# Patient Record
Sex: Male | Born: 1975
Health system: Southern US, Community
[De-identification: ages and names within clinical notes are randomized; demographics above are authoritative.]

## PROBLEM LIST (undated history)

## (undated) DIAGNOSIS — R0789 Other chest pain: Secondary | ICD-10-CM

## (undated) DIAGNOSIS — F419 Anxiety disorder, unspecified: Secondary | ICD-10-CM

## (undated) DIAGNOSIS — T7840XA Allergy, unspecified, initial encounter: Secondary | ICD-10-CM

## (undated) DIAGNOSIS — I10 Essential (primary) hypertension: Secondary | ICD-10-CM

## (undated) DIAGNOSIS — S61219A Laceration without foreign body of unspecified finger without damage to nail, initial encounter: Secondary | ICD-10-CM

## (undated) DIAGNOSIS — G43909 Migraine, unspecified, not intractable, without status migrainosus: Secondary | ICD-10-CM

## (undated) DIAGNOSIS — J45909 Unspecified asthma, uncomplicated: Secondary | ICD-10-CM

## (undated) DIAGNOSIS — G4730 Sleep apnea, unspecified: Secondary | ICD-10-CM

## (undated) DIAGNOSIS — E785 Hyperlipidemia, unspecified: Secondary | ICD-10-CM

## (undated) HISTORY — DX: Allergy, unspecified, initial encounter: T78.40XA

## (undated) HISTORY — DX: Essential (primary) hypertension: I10

## (undated) HISTORY — PX: ADENOIDECTOMY: SUR15

## (undated) HISTORY — DX: Hyperlipidemia, unspecified: E78.5

## (undated) HISTORY — PX: TYMPANOSTOMY TUBE PLACEMENT: SHX32

## (undated) HISTORY — DX: Sleep apnea, unspecified: G47.30

## (undated) HISTORY — DX: Other chest pain: R07.89

## (undated) HISTORY — DX: Laceration without foreign body of unspecified finger without damage to nail, initial encounter: S61.219A

## (undated) HISTORY — DX: Anxiety disorder, unspecified: F41.9

## (undated) HISTORY — DX: Unspecified asthma, uncomplicated: J45.909

## (undated) HISTORY — DX: Migraine, unspecified, not intractable, without status migrainosus: G43.909

---
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
## 2003-06-21 HISTORY — PX: APPENDECTOMY: SHX54

## 2006-06-20 HISTORY — PX: MYRINGOTOMY WITH TUBE PLACEMENT: SHX5663

## 2014-05-03 ENCOUNTER — Emergency Department (HOSPITAL_COMMUNITY)
Admission: EM | Admit: 2014-05-03 | Discharge: 2014-05-03 | Disposition: A | Discharge status: Home / Self Care | Payer: Managed Care, Other (non HMO) | Attending: Emergency Medicine | Admitting: Emergency Medicine

## 2014-05-03 ENCOUNTER — Emergency Department (HOSPITAL_COMMUNITY): Payer: Managed Care, Other (non HMO)

## 2014-05-03 ENCOUNTER — Encounter (HOSPITAL_COMMUNITY): Payer: Self-pay | Admitting: Emergency Medicine

## 2014-05-03 DIAGNOSIS — S61213A Laceration without foreign body of left middle finger without damage to nail, initial encounter: Secondary | ICD-10-CM | POA: Insufficient documentation

## 2014-05-03 DIAGNOSIS — S6992XA Unspecified injury of left wrist, hand and finger(s), initial encounter: Secondary | ICD-10-CM

## 2014-05-03 DIAGNOSIS — Z23 Encounter for immunization: Secondary | ICD-10-CM | POA: Insufficient documentation

## 2014-05-03 DIAGNOSIS — Y9289 Other specified places as the place of occurrence of the external cause: Secondary | ICD-10-CM | POA: Diagnosis not present

## 2014-05-03 DIAGNOSIS — Y998 Other external cause status: Secondary | ICD-10-CM | POA: Diagnosis not present

## 2014-05-03 DIAGNOSIS — W270XXA Contact with workbench tool, initial encounter: Secondary | ICD-10-CM | POA: Diagnosis not present

## 2014-05-03 DIAGNOSIS — IMO0002 Reserved for concepts with insufficient information to code with codable children: Secondary | ICD-10-CM

## 2014-05-03 DIAGNOSIS — Y9389 Activity, other specified: Secondary | ICD-10-CM | POA: Diagnosis not present

## 2014-05-03 MED ORDER — LIDOCAINE HCL 1 % IJ SOLN
5.0000 mL | Freq: Once | INTRAMUSCULAR | Status: DC
  Filled 2014-05-03: qty 20

## 2014-05-03 MED ORDER — TETANUS-DIPHTH-ACELL PERTUSSIS 5-2.5-18.5 LF-MCG/0.5 IM SUSP
0.5000 mL | Freq: Once | INTRAMUSCULAR | Status: AC
  Administered 2014-05-03: 0.5 mL via INTRAMUSCULAR
  Filled 2014-05-03: qty 0.5

## 2014-05-03 NOTE — ED Provider Notes (Signed)
CSN: [id number]     Arrival date & time 05/03/14  1813 History  This chart was scribed for non-physician practitioner, John MuttonMarissa Loris Seelye, PA-C, working with Warnell Foresterrey Wofford, MD, by Modena JanskyAlbert Thayil, ED Scribe. This patient was seen in room WTR9/WTR9 and the patient's care was started at 8:10 PM.   Chief Complaint  Patient presents with  . Extremity Laceration   The history is provided by the patient. No language interpreter was used.   HPI Comments: John Herman is a 38 y.o. male with no PMHx who presents to the Emergency Department complaining of an extremity laceration that occurred about 3 hours ago. He reports that he cut his left long finger with a table saw while cutting wood without gloves. He states that he has some associated mild numbness, tingling, and pain. He denies any hx of injury to his left long finger, complete loss of sensation. He reports that he is unsure of his tetanus status.  PCP Dr. Hyacinth MeekerMiller  History reviewed. No pertinent past medical history. History reviewed. No pertinent past surgical history. History reviewed. No pertinent family history. History  Substance Use Topics  . Smoking status: Never Smoker   . Smokeless tobacco: Not on file  . Alcohol Use: No    Review of Systems  Skin: Positive for wound.  Neurological: Negative for weakness.  All other systems reviewed and are negative.   Allergies  Review of patient's allergies indicates no known allergies.  Home Medications   Prior to Admission medications   Not on File   BP 149/94 mmHg  Pulse 82  Temp(Src) 98 F (36.7 C) (Oral)  Resp 18  SpO2 100% Physical Exam  Constitutional: He is oriented to person, place, and time. He appears well-developed and well-nourished. No distress.  HENT:  Head: Normocephalic and atraumatic.  Eyes: Conjunctivae and EOM are normal. Right eye exhibits no discharge. Left eye exhibits no discharge.  Neck: Normal range of motion. Neck supple.  Cardiovascular: Normal rate,  regular rhythm and normal heart sounds.  Exam reveals no friction rub.   No murmur heard. Pulses:      Radial pulses are 2+ on the right side, and 2+ on the left side.  Cap refill < 3 seconds  Pulmonary/Chest: Effort normal and breath sounds normal. No respiratory distress. He has no wheezes. He has no rales.  Musculoskeletal: Normal range of motion.  Full range of motion identified to the left hand without difficulty. Full flexion, extension, adduction, abduction noted to the left digits without difficulty. Patient is able to produce a fist without difficulty.    Neurological: He is alert and oriented to person, place, and time. No cranial nerve deficit. He exhibits normal muscle tone. Coordination normal.  Cranial nerves III-XII grossly intact Equal grip strength bilaterally  Strength intact to MCP, PIP, DIP joints of left hand Sensation intact with differentiation to sharp and dull touch  2 point discrimination intact to ulnar and radial aspect of the left long finger   Skin: Skin is warm and dry. No rash noted. He is not diaphoretic. No erythema.  Loss of epidermis identified to the tip of the left long finger. Negative flap. Negative involvement of the nail. Bleeding controlled. Negative blackening of tissues. Negative signs of ischemia.   Psychiatric: He has a normal mood and affect. His behavior is normal. Thought content normal.  Nursing note and vitals reviewed.   ED Course  Procedures (including critical care time) DIAGNOSTIC STUDIES: Oxygen Saturation is 100% on RA, normal by  my interpretation.    COORDINATION OF CARE: 8:14 PM- Pt advised of plan for treatment which includes medication and radiology and pt agrees.  Labs Review Labs Reviewed - No data to display  Imaging Review Dg Finger Middle Left  05/03/2014   CLINICAL DATA:  Left middle finger laceration, cut with table saw 05/03/2014  EXAM: LEFT MIDDLE FINGER 2+V  COMPARISON:  None.  FINDINGS: Three views of left  third finger submitted. No acute fracture or subluxation. No radiopaque foreign body.  IMPRESSION: Negative.   Electronically Signed   By: Natasha MeadLiviu  Pop M.D.   On: 05/03/2014 20:06     EKG Interpretation None      MDM   Final diagnoses:  Injury of left middle finger, initial encounter    Medications  Tdap (BOOSTRIX) injection 0.5 mL (0.5 mLs Intramuscular Given 05/03/14 2116)   Filed Vitals:   05/03/14 1839  BP: 149/94  Pulse: 82  Temp: 98 F (36.7 C)  TempSrc: Oral  Resp: 18  SpO2: 100%   I personally performed the services described in this documentation, which was scribed in my presence. The recorded information has been reviewed and is accurate.  Patient presenting to emergency department with a wound to the left long finger, tip that occurred this afternoon at approximately 5:30 PM when the patient was cutting wood without gloves using a table saw. Patient reported that he is unsure of his last tetanus shot. Loss of epidermis identified to the tip of the left long finger with no flap. Negative involvement of nail. Full range of motion identified to the digits of the left hand without difficulty or ataxia. Patient is able to produce a fist without difficulty. Sensation intact. 2. Discrimination intact. Negative signs of ischemia. Pulses palpable and strong. Negative focal neurological deficits identified. Imaging unremarkable for foreign body or fracture. Wound soaked in thoroughly cleaned. Xeroform placed and wound covered with gauze. Patient given tetanus in ED setting. Patient stable, afebrile. Patient not septic appearing. Discharged patient. Discussed with patient to rest and stay hydrated. Discussed with patient proper wound care. Referred patient to primary care provider and hand specialist. Discussed with patient to closely monitor symptoms and if symptoms are to worsen or change to report back to the ED - strict return instructions given.  Patient agreed to plan of care,  understood, all questions answered.   John MuttonMarissa Jamaury Gumz, PA-C 05/03/14 2122  Warnell Foresterrey Wofford, MD 05/03/14 2250

## 2014-05-03 NOTE — ED Notes (Signed)
Pt states that he cut his lt middle end of finger.  Jagged cut to the end of finger.  Has not had tetanus shot recently.

## 2014-05-03 NOTE — Discharge Instructions (Signed)
Please call your doctor for a followup appointment within 24-48 hours. When you talk to your doctor please let them know that you were seen in the emergency department and have them acquire all of your records so that they can discuss the findings with you and formulate a treatment plan to fully care for your new and ongoing problems. Please call and set-up an appointment with your primary care provider and hand specialist Please rest and stay hydrated Please wash with warm water and soap, massage gently, gently wash away with water and pat day  Please apply Xeroform and dressing - dressing must be dry if gets wet please replace Please continue to monitor symptoms closely and if symptoms are to worsen or change (fever greater than 101, chills, sweating, nausea, vomiting, chest pain, shortness of breathe, difficulty breathing, weakness, numbness, tingling, worsening or changes to pain pattern, swelling, loss of sensation, drainage, bleeding, pus drainage, redness to the skin, hot to touch, fall, injury) please report back to the Emergency Department immediately.   Laceration Care, Adult A laceration is a cut or lesion that goes through all layers of the skin and into the tissue just beneath the skin. TREATMENT  Some lacerations may not require closure. Some lacerations may not be able to be closed due to an increased risk of infection. It is important to see your caregiver as soon as possible after an injury to minimize the risk of infection and maximize the opportunity for successful closure. If closure is appropriate, pain medicines may be given, if needed. The wound will be cleaned to help prevent infection. Your caregiver will use stitches (sutures), staples, wound glue (adhesive), or skin adhesive strips to repair the laceration. These tools bring the skin edges together to allow for faster healing and a better cosmetic outcome. However, all wounds will heal with a scar. Once the wound has healed,  scarring can be minimized by covering the wound with sunscreen during the day for 1 full year. HOME CARE INSTRUCTIONS  For sutures or staples:  Keep the wound clean and dry.  If you were given a bandage (dressing), you should change it at least once a day. Also, change the dressing if it becomes wet or dirty, or as directed by your caregiver.  Wash the wound with soap and water 2 times a day. Rinse the wound off with water to remove all soap. Pat the wound dry with a clean towel.  After cleaning, apply a thin layer of the antibiotic ointment as recommended by your caregiver. This will help prevent infection and keep the dressing from sticking.  You may shower as usual after the first 24 hours. Do not soak the wound in water until the sutures are removed.  Only take over-the-counter or prescription medicines for pain, discomfort, or fever as directed by your caregiver.  Get your sutures or staples removed as directed by your caregiver. For skin adhesive strips:  Keep the wound clean and dry.  Do not get the skin adhesive strips wet. You may bathe carefully, using caution to keep the wound dry.  If the wound gets wet, pat it dry with a clean towel.  Skin adhesive strips will fall off on their own. You may trim the strips as the wound heals. Do not remove skin adhesive strips that are still stuck to the wound. They will fall off in time. For wound adhesive:  You may briefly wet your wound in the shower or bath. Do not soak or scrub the  wound. Do not swim. Avoid periods of heavy perspiration until the skin adhesive has fallen off on its own. After showering or bathing, gently pat the wound dry with a clean towel.  Do not apply liquid medicine, cream medicine, or ointment medicine to your wound while the skin adhesive is in place. This may loosen the film before your wound is healed.  If a dressing is placed over the wound, be careful not to apply tape directly over the skin adhesive. This  may cause the adhesive to be pulled off before the wound is healed.  Avoid prolonged exposure to sunlight or tanning lamps while the skin adhesive is in place. Exposure to ultraviolet light in the first year will darken the scar.  The skin adhesive will usually remain in place for 5 to 10 days, then naturally fall off the skin. Do not pick at the adhesive film. You may need a tetanus shot if:  You cannot remember when you had your last tetanus shot.  You have never had a tetanus shot. If you get a tetanus shot, your arm may swell, get red, and feel warm to the touch. This is common and not a problem. If you need a tetanus shot and you choose not to have one, there is a rare chance of getting tetanus. Sickness from tetanus can be serious. SEEK MEDICAL CARE IF:   You have redness, swelling, or increasing pain in the wound.  You see a red line that goes away from the wound.  You have yellowish-white fluid (pus) coming from the wound.  You have a fever.  You notice a bad smell coming from the wound or dressing.  Your wound breaks open before or after sutures have been removed.  You notice something coming out of the wound such as wood or glass.  Your wound is on your hand or foot and you cannot move a finger or toe. SEEK IMMEDIATE MEDICAL CARE IF:   Your pain is not controlled with prescribed medicine.  You have severe swelling around the wound causing pain and numbness or a change in color in your arm, hand, leg, or foot.  Your wound splits open and starts bleeding.  You have worsening numbness, weakness, or loss of function of any joint around or beyond the wound.  You develop painful lumps near the wound or on the skin anywhere on your body. MAKE SURE YOU:   Understand these instructions.  Will watch your condition.  Will get help right away if you are not doing well or get worse. Document Released: 06/06/2005 Document Revised: 08/29/2011 Document Reviewed:  11/30/2010 [facility] Patient Information 2015 [place], [place]. This information is not intended to replace advice given to you by your health care provider. Make sure you discuss any questions you have with your health care provider.

## 2014-12-23 ENCOUNTER — Encounter (HOSPITAL_COMMUNITY): Payer: Self-pay | Admitting: Emergency Medicine

## 2014-12-23 ENCOUNTER — Emergency Department (HOSPITAL_COMMUNITY)
Admission: EM | Admit: 2014-12-23 | Discharge: 2014-12-23 | Disposition: A | Discharge status: Home / Self Care | Payer: Managed Care, Other (non HMO) | Attending: Emergency Medicine | Admitting: Emergency Medicine

## 2014-12-23 ENCOUNTER — Emergency Department (HOSPITAL_COMMUNITY): Payer: Managed Care, Other (non HMO)

## 2014-12-23 DIAGNOSIS — Z7982 Long term (current) use of aspirin: Secondary | ICD-10-CM | POA: Insufficient documentation

## 2014-12-23 DIAGNOSIS — I10 Essential (primary) hypertension: Secondary | ICD-10-CM | POA: Diagnosis not present

## 2014-12-23 DIAGNOSIS — Z79899 Other long term (current) drug therapy: Secondary | ICD-10-CM | POA: Diagnosis not present

## 2014-12-23 DIAGNOSIS — R0789 Other chest pain: Secondary | ICD-10-CM | POA: Insufficient documentation

## 2014-12-23 DIAGNOSIS — R079 Chest pain, unspecified: Secondary | ICD-10-CM | POA: Diagnosis present

## 2014-12-23 LAB — BASIC METABOLIC PANEL
Anion gap: 9 (ref 5–15)
BUN: 26 mg/dL — ABNORMAL HIGH (ref 6–20)
CALCIUM: 9.9 mg/dL (ref 8.9–10.3)
CO2: 27 mmol/L (ref 22–32)
CREATININE: 1.16 mg/dL (ref 0.61–1.24)
Chloride: 101 mmol/L (ref 101–111)
GFR calc Af Amer: >60 mL/min (ref >60)
GLUCOSE: 103 mg/dL — AB (ref 65–99)
Potassium: 3.7 mmol/L (ref 3.5–5.1)
Sodium: 137 mmol/L (ref 135–145)

## 2014-12-23 LAB — CBC
HCT: 47.6 % (ref 39.0–52.0)
Hemoglobin: 16 g/dL (ref 13.0–17.0)
MCH: 28.3 pg (ref 26.0–34.0)
MCHC: 33.6 g/dL (ref 30.0–36.0)
MCV: 84.2 fL (ref 78.0–100.0)
Platelets: 298 10*3/uL (ref 150–400)
RBC: 5.65 MIL/uL (ref 4.22–5.81)
RDW: 12.7 % (ref 11.5–15.5)
WBC: 7 10*3/uL (ref 4.0–10.5)

## 2014-12-23 LAB — I-STAT TROPONIN, ED: Troponin i, poc: 0.01 ng/mL (ref 0.00–0.08)

## 2014-12-23 NOTE — ED Notes (Signed)
Pt c/o left chest pain, left arm pain, left side pain onset Friday night, pain decreased on Saturday, then worsened on Monday. Pt states that he was playing softball on Friday and noticed black and blue ecchymosis to left chest after softball on Friday, which faded away by Sunday.

## 2014-12-23 NOTE — Discharge Instructions (Signed)
For pain control please take ibuprofen (also known as Motrin or Advil) 800mg  (this is normally 4 over the counter pills) 3 times a day  for 5 days. Take with food to minimize stomach irritation.  Please follow with your primary care doctor in the next 2 days for a check-up. They must obtain records for further management.   Do not hesitate to return to the Emergency Department for any new, worsening or concerning symptoms.    Chest Wall Pain Chest wall pain is pain in or around the bones and muscles of your chest. It may take up to 6 weeks to get better. It may take longer if you must stay physically active in your work and activities.  CAUSES  Chest wall pain may happen on its own. However, it may be caused by:  A viral illness like the flu.  Injury.  Coughing.  Exercise.  Arthritis.  Fibromyalgia.  Shingles. HOME CARE INSTRUCTIONS   Avoid overtiring physical activity. Try not to strain or perform activities that cause pain. This includes any activities using your chest or your abdominal and side muscles, especially if heavy weights are used.  Put ice on the sore area.  Put ice in a plastic bag.  Place a towel between your skin and the bag.  Leave the ice on for 15-20 minutes per hour while awake for the first 2 days.  Only take over-the-counter or prescription medicines for pain, discomfort, or fever as directed by your caregiver. SEEK IMMEDIATE MEDICAL CARE IF:   Your pain increases, or you are very uncomfortable.  You have a fever.  Your chest pain becomes worse.  You have new, unexplained symptoms.  You have nausea or vomiting.  You feel sweaty or lightheaded.  You have a cough with phlegm (sputum), or you cough up blood. MAKE SURE YOU:   Understand these instructions.  Will watch your condition.  Will get help right away if you are not doing well or get worse. Document Released: 06/06/2005 Document Revised: 08/29/2011 Document Reviewed:  01/31/2011 [facility]Care Patient Information 2015 [place], [place]. This information is not intended to replace advice given to you by your health care provider. Make sure you discuss any questions you have with your health care provider.

## 2014-12-23 NOTE — ED Provider Notes (Signed)
CSN: [id number]     Arrival date & time 12/23/14  1714 History   First MD Initiated Contact with Patient 12/23/14 1958     Chief Complaint  Patient presents with  . Chest Pain  . Arm Pain     (Consider location/radiation/quality/duration/timing/severity/associated sxs/prior Treatment) HPI   Blood pressure 139/76, pulse 79, temperature 97.6 F (36.4 C), temperature source Oral, resp. rate 18, SpO2 100 %.  John Herman is a 39 y.o. male complaining of left-sided chest pain which she rates it 1 out of 10 which is exacerbated by movement and palpation. He states that patient was playing softball on Friday, he noticed a bruise in the area where he had pain the day after, states that this bruise was light disappeared after about 24 hours. Patient denies shortness of breath, diaphoresis, cocaine, methamphetamine use, he denies active smoking but he has an 8-pack-year history, he quit 10 years ago. He is compliant with his hypertension medication. He denies any family history of ACS, diabetes, hyperlipidemia, history of DVT or PE, recent trips or immobilizations, calf pain or leg swelling.  History reviewed. No pertinent past medical history. History reviewed. No pertinent past surgical history. History reviewed. No pertinent family history. History  Substance Use Topics  . Smoking status: Never Smoker   . Smokeless tobacco: Not on file  . Alcohol Use: No    Review of Systems  10 systems reviewed and found to be negative, except as noted in the HPI.  Allergies  Review of patient's allergies indicates no known allergies.  Home Medications   Prior to Admission medications   Medication Sig Start Date End Date Taking? Authorizing Provider  acetaminophen (TYLENOL) 325 MG tablet Take 325 mg by mouth every 6 (six) hours as needed for moderate pain or headache (headache).   Yes Historical Provider, MD  aspirin 81 MG tablet Take 81 mg by mouth daily as needed for pain.   Yes Historical  Provider, MD  ibuprofen (ADVIL,MOTRIN) 200 MG tablet Take 600 mg by mouth every 6 (six) hours as needed for moderate pain.   Yes Historical Provider, MD  lisinopril (PRINIVIL,ZESTRIL) 20 MG tablet Take 20 mg by mouth daily.   Yes Historical Provider, MD  metoprolol succinate (TOPROL-XL) 100 MG 24 hr tablet Take 100 mg by mouth daily. Take with or immediately following a meal.   Yes Historical Provider, MD   BP 139/76 mmHg  Pulse 79  Temp(Src) 97.6 F (36.4 C) (Oral)  Resp 18  SpO2 100% Physical Exam  Constitutional: He is oriented to person, place, and time. He appears well-developed and well-nourished. No distress.  HENT:  Head: Normocephalic.  Mouth/Throat: Oropharynx is clear and moist.  Eyes: Conjunctivae are normal.  Neck: Normal range of motion. No JVD present. No tracheal deviation present.  Cardiovascular: Normal rate, regular rhythm and intact distal pulses.   Radial pulse equal bilaterally  Pulmonary/Chest: Effort normal and breath sounds normal. No stridor. No respiratory distress. He has no wheezes. He has no rales. He exhibits no tenderness.  No tenderness to palpation along the chest wall, chest pain is elicited when patient abducts left shoulder  Abdominal: Soft. He exhibits no distension and no mass. There is no tenderness. There is no rebound and no guarding.  Musculoskeletal: Normal range of motion. He exhibits no edema or tenderness.  No calf asymmetry, superficial collaterals, palpable cords, edema, Homans sign negative bilaterally.    Neurological: He is alert and oriented to person, place, and time.  Skin: Skin  is warm. He is not diaphoretic.  Psychiatric: He has a normal mood and affect.  Nursing note and vitals reviewed.   ED Course  Procedures (including critical care time) Labs Review Labs Reviewed  BASIC METABOLIC PANEL - Abnormal; Notable for the following:    Glucose, Bld 103 (*)    BUN 26 (*)    All other components within normal limits  CBC   I-STAT TROPOININ, ED    Imaging Review Dg Chest 2 View  12/23/2014   CLINICAL DATA:  Left side chest pain  EXAM: CHEST  2 VIEW  COMPARISON:  None.  FINDINGS: The heart size and mediastinal contours are within normal limits. Both lungs are clear. The visualized skeletal structures are unremarkable.  IMPRESSION: No active cardiopulmonary disease.   Electronically Signed   By: Natasha MeadLiviu  Pop M.D.   On: 12/23/2014 18:34     EKG Interpretation None      MDM   Final diagnoses:  Chest wall pain    Filed Vitals:   12/23/14 1800 12/23/14 2031  BP: 155/88 139/76  Pulse: 82 79  Temp: 97.6 F (36.4 C)   TempSrc: Oral   Resp: 18 18  SpO2: 100% 100%     John Herman is a pleasant 39 y.o. male presenting with chest pain onset after patient was playing softball, exacerbated by movement. This is a mild pain with no associated symptoms. EKG is nonischemic, troponin is negative, chest x-rays without infiltrate. Patient is low risk by heart score. Patient will follow with his primary care physician, we've had a discussion of return precautions, recommend NSAIDs for chest wall pain.  Evaluation does not show pathology that would require ongoing emergent intervention or inpatient treatment. Pt is hemodynamically stable and mentating appropriately. Discussed findings and plan with patient/guardian, who agrees with care plan. All questions answered. Return precautions discussed and outpatient follow up given.        John Emeryicole Marybell Robards, PA-C 12/23/14 [id number]  Arby BarretteMarcy Pfeiffer, MD 12/24/14 [phone]

## 2015-02-10 ENCOUNTER — Other Ambulatory Visit: Payer: Self-pay

## 2015-02-10 DIAGNOSIS — G4730 Sleep apnea, unspecified: Secondary | ICD-10-CM | POA: Insufficient documentation

## 2015-02-10 DIAGNOSIS — S61219A Laceration without foreign body of unspecified finger without damage to nail, initial encounter: Secondary | ICD-10-CM | POA: Insufficient documentation

## 2015-02-10 DIAGNOSIS — F419 Anxiety disorder, unspecified: Secondary | ICD-10-CM | POA: Insufficient documentation

## 2015-02-10 DIAGNOSIS — I10 Essential (primary) hypertension: Secondary | ICD-10-CM | POA: Insufficient documentation

## 2015-02-10 DIAGNOSIS — E785 Hyperlipidemia, unspecified: Secondary | ICD-10-CM | POA: Insufficient documentation

## 2015-02-10 DIAGNOSIS — G43909 Migraine, unspecified, not intractable, without status migrainosus: Secondary | ICD-10-CM | POA: Insufficient documentation

## 2015-02-12 ENCOUNTER — Telehealth: Payer: Self-pay | Admitting: *Deleted

## 2015-02-12 NOTE — Telephone Encounter (Signed)
Patient has New Patient appointment scheduled for next week with Dr. Anne Fu. Patient verbalizes that he is anxious and having what he thinks is panic attacks because he is concerned with the possibility of a potential heart problem. In early July, he was playing softball and experienced CP with radiating arm pain. He went to ED at [facility]. He states EKG normal, troponins negative and he was diagnosed with chest wall muscle pain/strain/pulled muscle.  He was released with recommendation to follow up with his PCP, Dr. Hyacinth Meeker, and he was given instruction that it may take up to six weeks to heal. He has an upcoming appt on Sept 1st for physical with PCP. Meanwhile, he self-scheduled a New Patient appt with Dr. Anne Fu for 8/30. He states his chest pain remains and he is just concerned that it is really a cardiac issue. He wants to be seen earlier than next week. Today is 8/25. His appt is 8/30. He currently denies dizziness, SOB, previous cardiac history, other complaints. He states that for a time a couple of weeks ago, it seemed to subside. However, he recently started playing softball again and the pain returned including the radiating arm pain. Advised patient that there are no New Pt appts available prior to 8/30 so the following advisements/recommendations given to patient: continue discharge instructions from ED (NSAIDS, rest), follow up with PCP until 8/30 appt with Dr. Anne Fu, return to ED for any worsening sx or new sx. Also offered patient suggestion to ask MD (Dr. Anne Fu or PCP) if this could be orthopedic/Rotator Cuff injury or anxiety related. Patient indicates that when he is not thinking of it - "I don't notice it as much, but when I do worry about it, it hurts more". Also advised that he can call PCP to see if there are other home tx (heat or cold) that would be recommended during pain episodes.Patient verbalized that if it gets worse or he has new sx he will either call PCP, go to ED or call us back,  otherwise he will keep appt with Dr. Anne Fu on 8/30. He verbalized appreciation for speaking at length with him today.

## 2015-02-17 ENCOUNTER — Encounter: Payer: Self-pay | Admitting: Cardiology

## 2015-02-17 ENCOUNTER — Ambulatory Visit (INDEPENDENT_AMBULATORY_CARE_PROVIDER_SITE_OTHER): Payer: Managed Care, Other (non HMO) | Admitting: Cardiology

## 2015-02-17 VITALS — BP 130/80 | HR 84 | Ht 75.0 in | Wt 218.4 lb

## 2015-02-17 DIAGNOSIS — R079 Chest pain, unspecified: Secondary | ICD-10-CM | POA: Insufficient documentation

## 2015-02-17 DIAGNOSIS — F419 Anxiety disorder, unspecified: Secondary | ICD-10-CM | POA: Diagnosis not present

## 2015-02-17 DIAGNOSIS — G43C0 Periodic headache syndromes in child or adult, not intractable: Secondary | ICD-10-CM

## 2015-02-17 DIAGNOSIS — R0789 Other chest pain: Secondary | ICD-10-CM

## 2015-02-17 NOTE — Progress Notes (Signed)
Cardiology Office Note   Date:  02/17/2015   ID:  John Herman, DOB Feb 22, 1976, MRN [id number]  PCP:  Neldon Labella, MD  Cardiologist:   Donato Schultz, MD       History of Present Illness: John Herman is a 39 y.o. male who presents for evaluation of chest pain. Approximately early July, went to emergency department with was described as chest discomfort, no exertional component, no shortness of breath or palpitations associated with it and was diagnosed with chest wall pain. He was given in the past ibuprofen 800 mg 3 times a day when necessary.  Pain was previously described as sharp pain in his left chest after playing baseball on 12/19/14. Baseball bat swung, immediate sharp discomfort. Went to the emergency room for evaluation. Had a bruise over his left chest wall. Troponin, chest x-ray, EKG was all normal in emergency room. He also has diagnosis of migraines, 1-2 per month and occasionally misses work for this and is having his FMLA form done by Dr. Sigmund Hazel. He also has hypertension, Toprol-XL and lisinopril are being utilized. Sleep apnea was also diagnosed in 2009. He is employed as a Financial controller.  Played soft ball again and felt pain again. Feels on left and right side. Trying to loose weight. Maybe dizziness. He had one night where he woke up with his hands feeling numb bilaterally, possible compression/carpal tunnel like symptom. He also described some anxiety/panic attacks, sometimes feels like exhaling.  He's not having any exertional chest discomfort. His father had aortic valve replacement, sees Dr. Tenny Craw.   Past Medical History  Diagnosis Date  . Hypertension   . Anxiety   . Sleep apnea   . Migraine headache   . Hyperlipidemia   . Laceration of finger   . Chest wall pain     Past Surgical History  Procedure Laterality Date  . Nasal septum surgery  1994  . Appendectomy  2005  . Myringotomy with tube placement  2008     Current Outpatient Prescriptions   Medication Sig Dispense Refill  . lisinopril (PRINIVIL,ZESTRIL) 20 MG tablet Take 20 mg by mouth daily.    . metoprolol succinate (TOPROL-XL) 100 MG 24 hr tablet Take 100 mg by mouth daily. Take with or immediately following a meal.    . SUMAtriptan (IMITREX) 100 MG tablet Take 100 mg by mouth daily as needed for migraine. May repeat in 2 hours if headache persists or recurs.     No current facility-administered medications for this visit.    Allergies:   Review of patient's allergies indicates no known allergies.    Social History:  The patient  reports that he quit smoking about 11 years ago. He does not have any smokeless tobacco history on file. He reports that he drinks alcohol. He reports that he does not use illicit drugs.   Family History:  The patient's family history includes Alcoholism in his sister; Hypertension in his father and mother; Other in his sister and sister. No early family history of coronary artery disease. Grandfather had MI. Father had aortic valve replaced.    ROS:  Please see the history of present illness.   Otherwise, review of systems are positive for none.   All other systems are reviewed and negative.    PHYSICAL EXAM: VS:  BP 130/80 mmHg  Pulse 84  Ht  (1.905 m)  Wt 218 lb 6.4 oz (99.066 kg)  BMI 27.30 kg/m2 , BMI Body mass index is 27.3  kg/(m^2). GEN: Well nourished, well developed, in no acute distress HEENT: normal Neck: no JVD, carotid bruits, or masses Cardiac: RRR; no murmurs, rubs, or gallops,no edema  Respiratory:  clear to auscultation bilaterally, normal work of breathing GI: soft, nontender, nondistended, + BS MS: no deformity or atrophy Skin: warm and dry, no rash Neuro:  Strength and sensation are intact Psych: euthymic mood, full affect   EKG:  EKG is not ordered today.   Prior EKG from 12/23/14 demonstrated sinus rhythm, heart rate 90, no other abnormalities. Personally viewed.   Recent Labs: 12/23/2014: BUN 26*;  Creatinine, Ser 1.16; Hemoglobin 16.0; Platelets 298; Potassium 3.7; Sodium 137   Troponin normal.   Lipid Panel No results found for: CHOL, TRIG, HDL, CHOLHDL, VLDL, LDLCALC, LDLDIRECT    Wt Readings from Last 3 Encounters:  02/17/15 218 lb 6.4 oz (99.066 kg)    Chest x-ray-12/23/14-personally reviewed shows no disease.  Total cholesterol 243, triglycerides 245, HDL 51, LDL 143. Prior lipid panel in 2014 was better with triglycerides of 124, LDL of 112. Creatinine 1.05 glucose 90  Other studies Reviewed: Additional studies/ records that were reviewed today include: labs, CXR, EKG, office note, ER note. Review of the above records demonstrates: as above   ASSESSMENT AND PLAN:  1.  Chest pain-risk factors include essential hypertension, hyperlipidemia. Episode in July seem to be clearly associated with injury after swinging a  bat with anterior chest bruise.  reassuring emergency room evaluation, normal troponin, normal EKG, normal chest x-ray, personally viewed. During exam, I was able to elicit some chest wall discomfort by increasing work on his pectoralis muscles. I encouraged stretching before and after exercise. I do not appreciate any murmur on exam. His father had aortic valve replacement, seen by Dr. Tenny Craw. I discussed with him the possibility of bicuspid valve. We will go ahead and continue to encourage exercise. I do believe that his symptoms were chest wall/musculoskeletal related. If they worsen, may seek advice from orthopedist or perhaps perform some physical therapy. Reassurance has been given.  2. Anxiety-encouraged mindfulness techniques, deep breathing. Lavender oil may help.  3. Sleep apnea-encourage CPAP.  4. Migraines-per primary, Dr. Hyacinth Meeker.     Current medicines are reviewed at length with the patient today.  The patient does not have concerns regarding medicines.  The following changes have been made:  no change  Labs/ tests ordered today include: none No  orders of the defined types were placed in this encounter.     Disposition:   FU with Skains in 1 year. Requested for prevention given his grandfather heart attack. Father, valve replacement.  Mathews Robinsons, MD  02/17/2015 10:10 AM    [facility] [address] [place], [place], [place]  [address] Phone: [phone]; Fax: [phone]

## 2015-02-17 NOTE — Patient Instructions (Signed)
Medication Instructions:  Your physician recommends that you continue on your current medications as directed. Please refer to the Current Medication list given to you today.  Follow-Up: Follow up in 1 year with Dr. Skains.  You will receive a letter in the mail 2 months before you are due.  Please call us when you receive this letter to schedule your follow up appointment.  Thank you for choosing [facility]!!       

## 2015-04-22 NOTE — Progress Notes (Signed)
Cardiology Office Note   Date:  04/23/2015   ID:  John Herman, DOB Mar 10, 1976, MRN [id number]  PCP:  Neldon Labella, MD  Cardiologist:  Dr. Anne Fu   Chest pain    History of Present Illness: John Herman is a 39 y.o. male with a history of HTN, HLD, migraines, OSA, anxiety and atypical chest pain who presents to clinic for recurrent chest pain.  He presented to the ED in 12/2014 with chest discomfort and diagnosed with chest wall pain and given ibuprofen 800 mg 3 times a day when necessary. Pain was previously described as sharp pain in his left chest after playing baseball on 12/19/14. Baseball bat swung, immediate sharp discomfort. Went to the emergency room for evaluation. Had a bruise over his left chest wall. Troponin, chest x-ray, EKG was all normal in emergency room.   He was seen by Dr. Anne Fu in the office on 02/17/15 for atypical chest pain. Dr. Anne Fu felt his symptoms were chest wall/musculoskeletal related as it was clearly after trauma and could be elicited with palpation.  If they worsen, he recommended that he seek advice from orthopedist or perhaps perform some physical therapy. They also discussed the possibility for bicuspid aortic valve, but no murmur appreciated on exam.  Today he presents for recurrent chest pain. His chest pain had gone away until this past Sunday when he was watching football. He had two quick, sharp pains with radiation to left arm and neck and even in leg. Sometimes he has right neck and arm tightness as well. Since Sunday he has had chest tightness everyday intermittently. He did have some chest pain this AM, but none right now. He does not notice this with exertion. No associated SOB, nausea or diaphoresis. No swelling in his legs or trouble lying flat.   No family history of CAD. His father had AS with AVR and grandfather aortic dissection.    Past Medical History  Diagnosis Date  . Hypertension   . Anxiety   . Sleep apnea   . Migraine  headache   . Hyperlipidemia   . Laceration of finger   . Chest wall pain     Past Surgical History  Procedure Laterality Date  . Nasal septum surgery  1994  . Appendectomy  2005  . Myringotomy with tube placement  2008     Current Outpatient Prescriptions  Medication Sig Dispense Refill  . atorvastatin (LIPITOR) 20 MG tablet Take 1 tablet (20 mg total) by mouth daily. 30 tablet 11  . lisinopril (PRINIVIL,ZESTRIL) 20 MG tablet Take 20 mg by mouth daily.    . metoprolol succinate (TOPROL-XL) 100 MG 24 hr tablet Take 100 mg by mouth daily. Take with or immediately following a meal.    . nitroGLYCERIN (NITROSTAT) 0.4 MG SL tablet Place 1 tablet (0.4 mg total) under the tongue every 5 (five) minutes as needed for chest pain. 25 tablet 3  . SUMAtriptan (IMITREX) 100 MG tablet Take 100 mg by mouth daily as needed for migraine. May repeat in 2 hours if headache persists or recurs.     No current facility-administered medications for this visit.    Allergies:   Review of patient's allergies indicates no known allergies.    Social History:  The patient  reports that he quit smoking about 11 years ago. He does not have any smokeless tobacco history on file. He reports that he drinks alcohol. He reports that he does not use illicit drugs.   Family History:  The patient'sfamily history includes Alcoholism in his sister; Hypertension in his father and mother; Other in his sister and sister.    ROS:  Please see the history of present illness.   Otherwise, review of systems are positive for none.   All other systems are reviewed and negative.    PHYSICAL EXAM: VS:  BP 138/88 mmHg  Pulse 84  Ht  (1.905 m)  Wt 222 lb (100.699 kg)  BMI 27.75 kg/m2 , BMI Body mass index is 27.75 kg/(m^2). GEN: Well nourished, well developed, in no acute distress. He does appear anxious HEENT: normal Neck: no JVD, carotid bruits, or masses Cardiac: RRR; no murmurs, rubs, or gallops,no edema    Respiratory:  clear to auscultation bilaterally, normal work of breathing GI: soft, nontender, nondistended, + BS MS: no deformity or atrophy Skin: warm and dry, no rash Neuro:  Strength and sensation are intact Psych: euthymic mood, full affect   EKG:  EKG is ordered today. The ekg ordered today demonstrates NSR HR  84. He does have some TWI in lead III and AVf . TWI inversion in lead III seen on previous tracing but now more pronounced. TWI in AVF new.    Recent Labs: 12/23/2014: BUN 26*; Creatinine, Ser 1.16; Hemoglobin 16.0; Platelets 298; Potassium 3.7; Sodium 137    Lipid Panel No results found for: CHOL, TRIG, HDL, CHOLHDL, VLDL, LDLCALC, LDLDIRECT    Wt Readings from Last 3 Encounters:  04/23/15 222 lb (100.699 kg)  02/17/15 218 lb 6.4 oz (99.066 kg)      Other studies Reviewed: Additional studies/ records that were reviewed today include: ECG (12/25/14) Review of the above records demonstrates: NSR HR 90   ASSESSMENT AND PLAN:  Chest pain-risk factors include essential hypertension and hyperlipidemia. Episode in July seem to be clearly associated with injury after swinging a bat with anterior chest bruise.Reassuring emergency room evaluation, normal troponin, normal EKG, normal chest x-ray. Seen by Dr Caro Hight in 01/2015 and provided reassurance.  Today he presents with recurrent chest pain. It has both typical and atypical features. ECG w/ TWI in lead III and AVf . TWI inversion in lead III seen on previous tracing but now more pronounced. TWI in AVF new. He is currently not having any chest pain/tightness. I will trial some SL NTG to see if this helps. I went over ECG with DOD, Dr. Eldridge Dace, who suggested stress ECHO. We also went over ED precautions. If he has any severe chest pain again, he has been instructed to present the emergency room. If he does okay we will plan for stress echo. If this is normal, I feel his chest pain is most likely related to anxiety.   HTN-  continue lisinopril  qd and toprol xl  qd. BP 138/88 today  HLD- TC: 243, TG 245, LDL 143, HDL 51. I will place him low dose statin today (atorva )  Anxiety-encouraged mindfulness techniques, deep breathing. He has a lot of anxiety surrounding his heart. He appears on the verge of tears when talking about his chest pain.   Sleep apnea- continue CPAP.  Migraines -per primary, Dr. Hyacinth Meeker.   Current medicines are reviewed at length with the patient today.  The patient does not have concerns regarding medicines.  The following changes have been made:  I have added atorvastatin  and SL NTG.   Labs/ tests ordered today include:   Orders Placed This Encounter  Procedures  . EKG 12-Lead  . ECHO STRESS TEST  Disposition:   FU with Dr. Anne FuSkains as needed depending on results of Stress ECHO  Signed, Janetta HoraHOMPSON, Benito Lemmerman R, PA-C  04/23/2015 10:51 AM    [facility] [address], [place], [place]  [address] Phone: [phone]; Fax: [phone]

## 2015-04-23 ENCOUNTER — Encounter: Payer: Self-pay | Admitting: Physician Assistant

## 2015-04-23 ENCOUNTER — Other Ambulatory Visit: Payer: Self-pay | Admitting: Physician Assistant

## 2015-04-23 ENCOUNTER — Ambulatory Visit (INDEPENDENT_AMBULATORY_CARE_PROVIDER_SITE_OTHER): Payer: BLUE CROSS/BLUE SHIELD | Admitting: Physician Assistant

## 2015-04-23 VITALS — BP 138/88 | HR 84 | Ht 75.0 in | Wt 222.0 lb

## 2015-04-23 DIAGNOSIS — R079 Chest pain, unspecified: Secondary | ICD-10-CM | POA: Diagnosis not present

## 2015-04-23 MED ORDER — ATORVASTATIN CALCIUM 20 MG PO TABS: 20.0000 mg | ORAL_TABLET | Freq: Every day | ORAL | Status: DC

## 2015-04-23 MED ORDER — NITROGLYCERIN 0.4 MG SL SUBL: 0.4000 mg | SUBLINGUAL_TABLET | SUBLINGUAL | Status: DC | PRN

## 2015-04-23 NOTE — Patient Instructions (Addendum)
Your physician recommends that you schedule a follow-up appointment in: As Needed unless Echo comes back abnormal  Your physician has recommended you make the following change in your medication: START Atorvastatin 20 mg daily  Your physician has requested that you have a stress echocardiogram. For further information please visit [email or web address]. Please follow instruction sheet as given.  If you need a refill on your cardiac medications before your next appointment, please call your pharmacy.

## 2015-04-28 ENCOUNTER — Ambulatory Visit (HOSPITAL_COMMUNITY)
Admission: RE | Admit: 2015-04-28 | Discharge: 2015-04-28 | Disposition: A | Discharge status: Home / Self Care | Payer: BLUE CROSS/BLUE SHIELD | Source: Ambulatory Visit | Attending: Physician Assistant | Admitting: Physician Assistant

## 2015-04-28 DIAGNOSIS — I493 Ventricular premature depolarization: Secondary | ICD-10-CM | POA: Insufficient documentation

## 2015-04-28 DIAGNOSIS — R079 Chest pain, unspecified: Secondary | ICD-10-CM | POA: Insufficient documentation

## 2015-04-28 LAB — ECHOCARDIOGRAM STRESS TEST
CHL CUP RESTING HR STRESS: 76 {beats}/min
Estimated workload: 11.9 METS
Exercise duration (min): 10 min
Exercise duration (sec): 3 s
MPHR: 179 {beats}/min
Peak HR: 162 {beats}/min
Percent HR: 90 %

## 2015-04-28 NOTE — Progress Notes (Signed)
  Echocardiogram Echocardiogram Stress Test has been performed.  Cathie BeamsGREGORY, Jullian Clayson 04/28/2015, 3:24 PM

## 2015-09-25 DIAGNOSIS — G4733 Obstructive sleep apnea (adult) (pediatric): Secondary | ICD-10-CM | POA: Diagnosis not present

## 2015-09-25 DIAGNOSIS — I10 Essential (primary) hypertension: Secondary | ICD-10-CM | POA: Diagnosis not present

## 2015-11-20 DIAGNOSIS — G4733 Obstructive sleep apnea (adult) (pediatric): Secondary | ICD-10-CM | POA: Diagnosis not present

## 2015-12-15 ENCOUNTER — Emergency Department (HOSPITAL_COMMUNITY): Payer: BLUE CROSS/BLUE SHIELD

## 2015-12-15 ENCOUNTER — Emergency Department (HOSPITAL_COMMUNITY)
Admission: EM | Admit: 2015-12-15 | Discharge: 2015-12-15 | Disposition: A | Discharge status: Home / Self Care | Payer: BLUE CROSS/BLUE SHIELD | Attending: Emergency Medicine | Admitting: Emergency Medicine

## 2015-12-15 ENCOUNTER — Encounter (HOSPITAL_COMMUNITY): Payer: Self-pay

## 2015-12-15 DIAGNOSIS — S62307A Unspecified fracture of fifth metacarpal bone, left hand, initial encounter for closed fracture: Secondary | ICD-10-CM | POA: Insufficient documentation

## 2015-12-15 DIAGNOSIS — Y929 Unspecified place or not applicable: Secondary | ICD-10-CM | POA: Diagnosis not present

## 2015-12-15 DIAGNOSIS — W1830XA Fall on same level, unspecified, initial encounter: Secondary | ICD-10-CM | POA: Insufficient documentation

## 2015-12-15 DIAGNOSIS — Y999 Unspecified external cause status: Secondary | ICD-10-CM | POA: Insufficient documentation

## 2015-12-15 DIAGNOSIS — Y9364 Activity, baseball: Secondary | ICD-10-CM | POA: Diagnosis not present

## 2015-12-15 DIAGNOSIS — S62397A Other fracture of fifth metacarpal bone, left hand, initial encounter for closed fracture: Secondary | ICD-10-CM | POA: Diagnosis not present

## 2015-12-15 DIAGNOSIS — Z87891 Personal history of nicotine dependence: Secondary | ICD-10-CM | POA: Diagnosis not present

## 2015-12-15 DIAGNOSIS — I10 Essential (primary) hypertension: Secondary | ICD-10-CM | POA: Insufficient documentation

## 2015-12-15 DIAGNOSIS — S62327A Displaced fracture of shaft of fifth metacarpal bone, left hand, initial encounter for closed fracture: Secondary | ICD-10-CM | POA: Diagnosis not present

## 2015-12-15 DIAGNOSIS — S62309A Unspecified fracture of unspecified metacarpal bone, initial encounter for closed fracture: Secondary | ICD-10-CM

## 2015-12-15 DIAGNOSIS — S6992XA Unspecified injury of left wrist, hand and finger(s), initial encounter: Secondary | ICD-10-CM | POA: Diagnosis present

## 2015-12-15 MED ORDER — IBUPROFEN 400 MG PO TABS
800.0000 mg | ORAL_TABLET | Freq: Once | ORAL | Status: AC
  Administered 2015-12-15: 800 mg via ORAL
  Filled 2015-12-15: qty 2

## 2015-12-15 NOTE — Progress Notes (Signed)
Orthopedic Tech Progress Note Patient Details:  John Herman 06/05/1976 [id number]      [name] 12/15/2015, 10:15 AM

## 2015-12-15 NOTE — Progress Notes (Signed)
Orthopedic Tech Progress Note Patient Details:  John Herman 18-Sep-1975 [id number]  Ortho Devices Type of Ortho Device: Ace wrap, Ulna gutter splint Ortho Device/Splint Interventions: Application   Saul FordyceJennifer C Marcell Chavarin 12/15/2015, 10:15 AM

## 2015-12-15 NOTE — ED Notes (Signed)
Left hand pain and swelling after falling while diving to catch softball last pm. Swelling noted to same.

## 2015-12-15 NOTE — ED Provider Notes (Signed)
CSN: [id number]     Arrival date & time 12/15/15  0913 History  By signing my name below, I, John Herman, attest that this documentation has been prepared under the direction and in the presence of Newell RubbermaidJeffrey Mekayla Soman, PA-C. Electronically Signed: Ronney LionSuzanne Herman, ED Scribe. 12/15/2015. 10:39 AM.    Chief Complaint  Patient presents with  . Hand Injury   The history is provided by the patient. No language interpreter was used.    HPI Comments: John Herman is a 40 y.o. male with a history of hypertension, who presents to the Emergency Department complaining of sudden-onset, 6/10 left hand pain and swelling after falling while diving to catch a softball last night. No other associated symptoms were noted. He reports no pain in his hand at rest, but reports pain with extension and flexion of his fingers. Patient states he prefers to see hand surgeon Dr. Melvyn Novasrtmann. He denies a history of prior hand surgeries. No treatments were noted.  Past Medical History  Diagnosis Date  . Hypertension   . Anxiety   . Sleep apnea   . Migraine headache   . Hyperlipidemia   . Laceration of finger   . Chest wall pain    Past Surgical History  Procedure Laterality Date  . Nasal septum surgery  1994  . Appendectomy  2005  . Myringotomy with tube placement  2008   Family History  Problem Relation Age of Onset  . Hypertension Mother   . Hypertension Father   . Alcoholism Sister   . Other Sister     HEALTHY  . Other Sister     HEALTHY   Social History  Substance Use Topics  . Smoking status: Former Smoker    Quit date: 02/10/2004  . Smokeless tobacco: None  . Alcohol Use: Yes     Comment: SOCIALLY    Review of Systems  Constitutional: Negative for fever.  Musculoskeletal: Positive for myalgias and joint swelling.    Allergies  Review of patient's allergies indicates no known allergies.  Home Medications   Prior to Admission medications   Medication Sig Start Date End Date Taking? Authorizing  Provider  atorvastatin (LIPITOR) 20 MG tablet Take 1 tablet (20 mg total) by mouth daily. 04/23/15  Yes Janetta HoraKathryn R Thompson, PA-C  ibuprofen (ADVIL,MOTRIN) 200 MG tablet Take 800 mg by mouth every 6 (six) hours as needed (pain).   Yes Historical Provider, MD  SUMAtriptan (IMITREX) 100 MG tablet Take 100 mg by mouth daily as needed for migraine. May repeat in 2 hours if headache persists or recurs.   Yes Historical Provider, MD  nitroGLYCERIN (NITROSTAT) 0.4 MG SL tablet Place 1 tablet (0.4 mg total) under the tongue every 5 (five) minutes as needed for chest pain. 04/23/15   Janetta HoraKathryn R Thompson, PA-C   BP 144/84 mmHg  Pulse 88  Temp(Src) 98.3 F (36.8 C) (Oral)  Resp 16  SpO2 100% Physical Exam  Constitutional: He is oriented to person, place, and time. He appears well-developed and well-nourished. No distress.  HENT:  Head: Normocephalic and atraumatic.  Eyes: Conjunctivae and EOM are normal.  Neck: Neck supple. No tracheal deviation present.  Cardiovascular: Normal rate.   Pulmonary/Chest: Effort normal. No respiratory distress.  Musculoskeletal: Normal range of motion. He exhibits edema and tenderness.  Mild swelling to right hand, with tenderness. Flexion and extension of the hand intact. Normal alignment of the fingers. Sensation intact  Neurological: He is alert and oriented to person, place, and time.  Skin: Skin  is warm and dry.  Psychiatric: He has a normal mood and affect. His behavior is normal.  Nursing note and vitals reviewed.   ED Course  Procedures (including critical care time)  DIAGNOSTIC STUDIES: Oxygen Saturation is 100% on RA, normal by my interpretation.    COORDINATION OF CARE: 10:07 AM - Discussed treatment plan with pt at bedside which includes follow-up with pt's preferred hand surgeon Dr. Melvyn Novasrtmann. Will give referral to hand surgeon on-call, Dr. Merlyn LotKuzma, to use as needed. Pt verbalized understanding and agreed to plan.   Imaging Review Dg Hand Complete  Left  12/15/2015  CLINICAL DATA:  Fall on left hand playing softball.  Left hand pain EXAM: LEFT HAND - COMPLETE 3+ VIEW COMPARISON:  None. FINDINGS: Oblique fracture through the left fifth metacarpal. Slight displacement. Overlying soft tissue swelling. No additional bony abnormality. IMPRESSION: Slightly displaced oblique fracture through the left fifth metacarpal. Electronically Signed   By: Charlett NoseKevin  Dover M.D.   On: 12/15/2015 09:40   I have personally reviewed and evaluated these images as part of my medical decision-making.  MDM   Final diagnoses:  Fx metacarpal, closed, initial encounter    Labs:  Imaging: DG Hand left  Consults:  Therapeutics: ibuprofen 800 mg  Discharge Meds:   Assessment/Plan:   Patient X-Ray positive for slightly displaced oblique fracture through the left fifth metacarpal. Pain managed in ED with ibuprofen. Pt advised to follow up with his preferred hand surgeon Dr. Melvyn Novasrtmann, although pt has also been given referral to hand surgeon on-call, Dr. Merlyn LotKuzma. Patient given brace while in ED (ulnar gutter splint). Patient will be dc home & is agreeable with above plan. Patient reports pain is very minimal and does not need pain medication. Patient has no loss of sensation or motor function, with normal alignment of the fingers.  I personally performed the services described in this documentation, which was scribed in my presence. The recorded information has been reviewed and is accurate.     Eyvonne MechanicJeffrey Miko Sirico, PA-C 12/15/15 1215  Doug SouSam Jacubowitz, MD 12/15/15 [phone]

## 2015-12-15 NOTE — Discharge Instructions (Signed)
Please contact orthopedic specialist today and schedule follow-up evaluation. If you experience any new or worsening signs or symptoms please return medially for further evaluation

## 2015-12-25 DIAGNOSIS — S62327D Displaced fracture of shaft of fifth metacarpal bone, left hand, subsequent encounter for fracture with routine healing: Secondary | ICD-10-CM | POA: Diagnosis not present

## 2015-12-30 DIAGNOSIS — Y999 Unspecified external cause status: Secondary | ICD-10-CM | POA: Diagnosis not present

## 2015-12-30 DIAGNOSIS — S62327A Displaced fracture of shaft of fifth metacarpal bone, left hand, initial encounter for closed fracture: Secondary | ICD-10-CM | POA: Diagnosis not present

## 2015-12-30 DIAGNOSIS — S62327D Displaced fracture of shaft of fifth metacarpal bone, left hand, subsequent encounter for fracture with routine healing: Secondary | ICD-10-CM | POA: Diagnosis not present

## 2016-01-11 DIAGNOSIS — S62327D Displaced fracture of shaft of fifth metacarpal bone, left hand, subsequent encounter for fracture with routine healing: Secondary | ICD-10-CM | POA: Diagnosis not present

## 2016-01-21 DIAGNOSIS — L02821 Furuncle of head [any part, except face]: Secondary | ICD-10-CM | POA: Diagnosis not present

## 2016-01-21 DIAGNOSIS — S62327D Displaced fracture of shaft of fifth metacarpal bone, left hand, subsequent encounter for fracture with routine healing: Secondary | ICD-10-CM | POA: Diagnosis not present

## 2016-01-21 DIAGNOSIS — B9689 Other specified bacterial agents as the cause of diseases classified elsewhere: Secondary | ICD-10-CM | POA: Diagnosis not present

## 2016-01-21 DIAGNOSIS — L218 Other seborrheic dermatitis: Secondary | ICD-10-CM | POA: Diagnosis not present

## 2016-01-25 DIAGNOSIS — H11441 Conjunctival cysts, right eye: Secondary | ICD-10-CM | POA: Diagnosis not present

## 2016-01-28 DIAGNOSIS — S62327D Displaced fracture of shaft of fifth metacarpal bone, left hand, subsequent encounter for fracture with routine healing: Secondary | ICD-10-CM | POA: Diagnosis not present

## 2016-01-28 DIAGNOSIS — Z4789 Encounter for other orthopedic aftercare: Secondary | ICD-10-CM | POA: Diagnosis not present

## 2016-02-04 DIAGNOSIS — Z189 Retained foreign body fragments, unspecified material: Secondary | ICD-10-CM | POA: Diagnosis not present

## 2016-02-04 DIAGNOSIS — H0551 Retained (old) foreign body following penetrating wound of right orbit: Secondary | ICD-10-CM | POA: Diagnosis not present

## 2016-02-04 DIAGNOSIS — Z87891 Personal history of nicotine dependence: Secondary | ICD-10-CM | POA: Diagnosis not present

## 2016-02-11 DIAGNOSIS — S62327D Displaced fracture of shaft of fifth metacarpal bone, left hand, subsequent encounter for fracture with routine healing: Secondary | ICD-10-CM | POA: Diagnosis not present

## 2016-02-15 DIAGNOSIS — Z189 Retained foreign body fragments, unspecified material: Secondary | ICD-10-CM | POA: Diagnosis not present

## 2016-02-15 DIAGNOSIS — H0551 Retained (old) foreign body following penetrating wound of right orbit: Secondary | ICD-10-CM | POA: Diagnosis not present

## 2016-02-17 DIAGNOSIS — S62327D Displaced fracture of shaft of fifth metacarpal bone, left hand, subsequent encounter for fracture with routine healing: Secondary | ICD-10-CM | POA: Diagnosis not present

## 2016-02-24 DIAGNOSIS — Z79899 Other long term (current) drug therapy: Secondary | ICD-10-CM | POA: Diagnosis not present

## 2016-02-24 DIAGNOSIS — G43909 Migraine, unspecified, not intractable, without status migrainosus: Secondary | ICD-10-CM | POA: Diagnosis not present

## 2016-02-24 DIAGNOSIS — F419 Anxiety disorder, unspecified: Secondary | ICD-10-CM | POA: Diagnosis not present

## 2016-02-24 DIAGNOSIS — G4733 Obstructive sleep apnea (adult) (pediatric): Secondary | ICD-10-CM | POA: Diagnosis not present

## 2016-02-24 DIAGNOSIS — E785 Hyperlipidemia, unspecified: Secondary | ICD-10-CM | POA: Diagnosis not present

## 2016-02-24 DIAGNOSIS — Z0000 Encounter for general adult medical examination without abnormal findings: Secondary | ICD-10-CM | POA: Diagnosis not present

## 2016-02-24 DIAGNOSIS — Z23 Encounter for immunization: Secondary | ICD-10-CM | POA: Diagnosis not present

## 2016-03-01 DIAGNOSIS — S62327D Displaced fracture of shaft of fifth metacarpal bone, left hand, subsequent encounter for fracture with routine healing: Secondary | ICD-10-CM | POA: Diagnosis not present

## 2016-04-04 DIAGNOSIS — S0541XD Penetrating wound of orbit with or without foreign body, right eye, subsequent encounter: Secondary | ICD-10-CM | POA: Diagnosis not present

## 2016-04-25 DIAGNOSIS — F419 Anxiety disorder, unspecified: Secondary | ICD-10-CM | POA: Diagnosis not present

## 2016-05-23 DIAGNOSIS — S0541XA Penetrating wound of orbit with or without foreign body, right eye, initial encounter: Secondary | ICD-10-CM | POA: Diagnosis not present

## 2016-05-23 DIAGNOSIS — T1591XA Foreign body on external eye, part unspecified, right eye, initial encounter: Secondary | ICD-10-CM | POA: Diagnosis not present

## 2016-07-14 DIAGNOSIS — G43909 Migraine, unspecified, not intractable, without status migrainosus: Secondary | ICD-10-CM | POA: Diagnosis not present

## 2016-08-16 DIAGNOSIS — M9902 Segmental and somatic dysfunction of thoracic region: Secondary | ICD-10-CM | POA: Diagnosis not present

## 2016-08-16 DIAGNOSIS — M9905 Segmental and somatic dysfunction of pelvic region: Secondary | ICD-10-CM | POA: Diagnosis not present

## 2016-08-16 DIAGNOSIS — M9903 Segmental and somatic dysfunction of lumbar region: Secondary | ICD-10-CM | POA: Diagnosis not present

## 2016-08-16 DIAGNOSIS — M5432 Sciatica, left side: Secondary | ICD-10-CM | POA: Diagnosis not present

## 2016-08-19 DIAGNOSIS — M9903 Segmental and somatic dysfunction of lumbar region: Secondary | ICD-10-CM | POA: Diagnosis not present

## 2016-08-19 DIAGNOSIS — M9902 Segmental and somatic dysfunction of thoracic region: Secondary | ICD-10-CM | POA: Diagnosis not present

## 2016-08-19 DIAGNOSIS — M5432 Sciatica, left side: Secondary | ICD-10-CM | POA: Diagnosis not present

## 2016-08-19 DIAGNOSIS — M9905 Segmental and somatic dysfunction of pelvic region: Secondary | ICD-10-CM | POA: Diagnosis not present

## 2016-08-23 DIAGNOSIS — M9903 Segmental and somatic dysfunction of lumbar region: Secondary | ICD-10-CM | POA: Diagnosis not present

## 2016-08-23 DIAGNOSIS — M9902 Segmental and somatic dysfunction of thoracic region: Secondary | ICD-10-CM | POA: Diagnosis not present

## 2016-08-23 DIAGNOSIS — M9905 Segmental and somatic dysfunction of pelvic region: Secondary | ICD-10-CM | POA: Diagnosis not present

## 2016-08-23 DIAGNOSIS — M5432 Sciatica, left side: Secondary | ICD-10-CM | POA: Diagnosis not present

## 2016-08-26 DIAGNOSIS — M9902 Segmental and somatic dysfunction of thoracic region: Secondary | ICD-10-CM | POA: Diagnosis not present

## 2016-08-26 DIAGNOSIS — M9905 Segmental and somatic dysfunction of pelvic region: Secondary | ICD-10-CM | POA: Diagnosis not present

## 2016-08-26 DIAGNOSIS — M9903 Segmental and somatic dysfunction of lumbar region: Secondary | ICD-10-CM | POA: Diagnosis not present

## 2016-08-26 DIAGNOSIS — M5432 Sciatica, left side: Secondary | ICD-10-CM | POA: Diagnosis not present

## 2016-08-29 DIAGNOSIS — M9905 Segmental and somatic dysfunction of pelvic region: Secondary | ICD-10-CM | POA: Diagnosis not present

## 2016-08-29 DIAGNOSIS — M5432 Sciatica, left side: Secondary | ICD-10-CM | POA: Diagnosis not present

## 2016-08-29 DIAGNOSIS — M9902 Segmental and somatic dysfunction of thoracic region: Secondary | ICD-10-CM | POA: Diagnosis not present

## 2016-08-29 DIAGNOSIS — M9903 Segmental and somatic dysfunction of lumbar region: Secondary | ICD-10-CM | POA: Diagnosis not present

## 2016-09-02 DIAGNOSIS — M9902 Segmental and somatic dysfunction of thoracic region: Secondary | ICD-10-CM | POA: Diagnosis not present

## 2016-09-02 DIAGNOSIS — M9905 Segmental and somatic dysfunction of pelvic region: Secondary | ICD-10-CM | POA: Diagnosis not present

## 2016-09-02 DIAGNOSIS — M9903 Segmental and somatic dysfunction of lumbar region: Secondary | ICD-10-CM | POA: Diagnosis not present

## 2016-09-02 DIAGNOSIS — M5432 Sciatica, left side: Secondary | ICD-10-CM | POA: Diagnosis not present

## 2016-09-06 DIAGNOSIS — M9903 Segmental and somatic dysfunction of lumbar region: Secondary | ICD-10-CM | POA: Diagnosis not present

## 2016-09-06 DIAGNOSIS — M9905 Segmental and somatic dysfunction of pelvic region: Secondary | ICD-10-CM | POA: Diagnosis not present

## 2016-09-06 DIAGNOSIS — M5432 Sciatica, left side: Secondary | ICD-10-CM | POA: Diagnosis not present

## 2016-09-06 DIAGNOSIS — M9902 Segmental and somatic dysfunction of thoracic region: Secondary | ICD-10-CM | POA: Diagnosis not present

## 2016-09-09 DIAGNOSIS — M9902 Segmental and somatic dysfunction of thoracic region: Secondary | ICD-10-CM | POA: Diagnosis not present

## 2016-09-09 DIAGNOSIS — M4126 Other idiopathic scoliosis, lumbar region: Secondary | ICD-10-CM | POA: Diagnosis not present

## 2016-09-09 DIAGNOSIS — M9903 Segmental and somatic dysfunction of lumbar region: Secondary | ICD-10-CM | POA: Diagnosis not present

## 2016-09-09 DIAGNOSIS — M9905 Segmental and somatic dysfunction of pelvic region: Secondary | ICD-10-CM | POA: Diagnosis not present

## 2016-09-13 DIAGNOSIS — M9905 Segmental and somatic dysfunction of pelvic region: Secondary | ICD-10-CM | POA: Diagnosis not present

## 2016-09-13 DIAGNOSIS — M4126 Other idiopathic scoliosis, lumbar region: Secondary | ICD-10-CM | POA: Diagnosis not present

## 2016-09-13 DIAGNOSIS — M9902 Segmental and somatic dysfunction of thoracic region: Secondary | ICD-10-CM | POA: Diagnosis not present

## 2016-09-13 DIAGNOSIS — M9903 Segmental and somatic dysfunction of lumbar region: Secondary | ICD-10-CM | POA: Diagnosis not present

## 2017-04-14 DIAGNOSIS — Z23 Encounter for immunization: Secondary | ICD-10-CM | POA: Diagnosis not present

## 2017-04-14 DIAGNOSIS — G43909 Migraine, unspecified, not intractable, without status migrainosus: Secondary | ICD-10-CM | POA: Diagnosis not present

## 2017-05-23 DIAGNOSIS — L218 Other seborrheic dermatitis: Secondary | ICD-10-CM | POA: Diagnosis not present

## 2017-05-24 DIAGNOSIS — M9905 Segmental and somatic dysfunction of pelvic region: Secondary | ICD-10-CM | POA: Diagnosis not present

## 2017-05-24 DIAGNOSIS — M9902 Segmental and somatic dysfunction of thoracic region: Secondary | ICD-10-CM | POA: Diagnosis not present

## 2017-05-24 DIAGNOSIS — M9903 Segmental and somatic dysfunction of lumbar region: Secondary | ICD-10-CM | POA: Diagnosis not present

## 2017-05-24 DIAGNOSIS — M4126 Other idiopathic scoliosis, lumbar region: Secondary | ICD-10-CM | POA: Diagnosis not present

## 2017-06-15 DIAGNOSIS — G43909 Migraine, unspecified, not intractable, without status migrainosus: Secondary | ICD-10-CM | POA: Diagnosis not present

## 2017-06-19 DIAGNOSIS — J069 Acute upper respiratory infection, unspecified: Secondary | ICD-10-CM | POA: Diagnosis not present

## 2018-01-21 DIAGNOSIS — I10 Essential (primary) hypertension: Secondary | ICD-10-CM | POA: Diagnosis not present

## 2018-01-21 DIAGNOSIS — R51 Headache: Secondary | ICD-10-CM | POA: Diagnosis not present

## 2018-01-22 DIAGNOSIS — R42 Dizziness and giddiness: Secondary | ICD-10-CM | POA: Diagnosis not present

## 2018-01-22 DIAGNOSIS — G43909 Migraine, unspecified, not intractable, without status migrainosus: Secondary | ICD-10-CM | POA: Diagnosis not present

## 2018-01-22 DIAGNOSIS — I10 Essential (primary) hypertension: Secondary | ICD-10-CM | POA: Diagnosis not present

## 2018-01-29 DIAGNOSIS — I10 Essential (primary) hypertension: Secondary | ICD-10-CM | POA: Diagnosis not present

## 2018-02-15 DIAGNOSIS — Z0000 Encounter for general adult medical examination without abnormal findings: Secondary | ICD-10-CM | POA: Diagnosis not present

## 2018-02-15 DIAGNOSIS — E785 Hyperlipidemia, unspecified: Secondary | ICD-10-CM | POA: Diagnosis not present

## 2019-01-09 DIAGNOSIS — G4733 Obstructive sleep apnea (adult) (pediatric): Secondary | ICD-10-CM | POA: Diagnosis not present

## 2019-01-22 DIAGNOSIS — G4733 Obstructive sleep apnea (adult) (pediatric): Secondary | ICD-10-CM | POA: Diagnosis not present

## 2019-02-27 DIAGNOSIS — E785 Hyperlipidemia, unspecified: Secondary | ICD-10-CM | POA: Diagnosis not present

## 2019-02-27 DIAGNOSIS — I10 Essential (primary) hypertension: Secondary | ICD-10-CM | POA: Diagnosis not present

## 2019-02-27 DIAGNOSIS — E559 Vitamin D deficiency, unspecified: Secondary | ICD-10-CM | POA: Diagnosis not present

## 2019-02-27 DIAGNOSIS — Z23 Encounter for immunization: Secondary | ICD-10-CM | POA: Diagnosis not present

## 2019-02-27 DIAGNOSIS — Z0000 Encounter for general adult medical examination without abnormal findings: Secondary | ICD-10-CM | POA: Diagnosis not present

## 2019-07-03 DIAGNOSIS — M9901 Segmental and somatic dysfunction of cervical region: Secondary | ICD-10-CM | POA: Diagnosis not present

## 2019-07-03 DIAGNOSIS — M542 Cervicalgia: Secondary | ICD-10-CM | POA: Diagnosis not present

## 2019-07-03 DIAGNOSIS — M9903 Segmental and somatic dysfunction of lumbar region: Secondary | ICD-10-CM | POA: Diagnosis not present

## 2019-07-03 DIAGNOSIS — M545 Low back pain: Secondary | ICD-10-CM | POA: Diagnosis not present

## 2019-07-04 DIAGNOSIS — M545 Low back pain: Secondary | ICD-10-CM | POA: Diagnosis not present

## 2019-07-04 DIAGNOSIS — M9901 Segmental and somatic dysfunction of cervical region: Secondary | ICD-10-CM | POA: Diagnosis not present

## 2019-07-04 DIAGNOSIS — M9903 Segmental and somatic dysfunction of lumbar region: Secondary | ICD-10-CM | POA: Diagnosis not present

## 2019-07-04 DIAGNOSIS — M542 Cervicalgia: Secondary | ICD-10-CM | POA: Diagnosis not present

## 2019-07-08 DIAGNOSIS — M545 Low back pain: Secondary | ICD-10-CM | POA: Diagnosis not present

## 2019-07-08 DIAGNOSIS — M542 Cervicalgia: Secondary | ICD-10-CM | POA: Diagnosis not present

## 2019-07-08 DIAGNOSIS — M9903 Segmental and somatic dysfunction of lumbar region: Secondary | ICD-10-CM | POA: Diagnosis not present

## 2019-07-08 DIAGNOSIS — M9901 Segmental and somatic dysfunction of cervical region: Secondary | ICD-10-CM | POA: Diagnosis not present

## 2019-07-15 DIAGNOSIS — M542 Cervicalgia: Secondary | ICD-10-CM | POA: Diagnosis not present

## 2019-07-15 DIAGNOSIS — M545 Low back pain: Secondary | ICD-10-CM | POA: Diagnosis not present

## 2019-07-15 DIAGNOSIS — M9901 Segmental and somatic dysfunction of cervical region: Secondary | ICD-10-CM | POA: Diagnosis not present

## 2019-07-15 DIAGNOSIS — M9903 Segmental and somatic dysfunction of lumbar region: Secondary | ICD-10-CM | POA: Diagnosis not present

## 2019-07-17 DIAGNOSIS — M545 Low back pain: Secondary | ICD-10-CM | POA: Diagnosis not present

## 2019-07-17 DIAGNOSIS — M9903 Segmental and somatic dysfunction of lumbar region: Secondary | ICD-10-CM | POA: Diagnosis not present

## 2019-07-17 DIAGNOSIS — M9901 Segmental and somatic dysfunction of cervical region: Secondary | ICD-10-CM | POA: Diagnosis not present

## 2019-07-17 DIAGNOSIS — M542 Cervicalgia: Secondary | ICD-10-CM | POA: Diagnosis not present

## 2019-07-18 DIAGNOSIS — M9901 Segmental and somatic dysfunction of cervical region: Secondary | ICD-10-CM | POA: Diagnosis not present

## 2019-07-18 DIAGNOSIS — M542 Cervicalgia: Secondary | ICD-10-CM | POA: Diagnosis not present

## 2019-07-18 DIAGNOSIS — M545 Low back pain: Secondary | ICD-10-CM | POA: Diagnosis not present

## 2019-07-18 DIAGNOSIS — M9903 Segmental and somatic dysfunction of lumbar region: Secondary | ICD-10-CM | POA: Diagnosis not present

## 2019-07-24 DIAGNOSIS — M9903 Segmental and somatic dysfunction of lumbar region: Secondary | ICD-10-CM | POA: Diagnosis not present

## 2019-07-24 DIAGNOSIS — M545 Low back pain: Secondary | ICD-10-CM | POA: Diagnosis not present

## 2019-07-24 DIAGNOSIS — M9901 Segmental and somatic dysfunction of cervical region: Secondary | ICD-10-CM | POA: Diagnosis not present

## 2019-07-24 DIAGNOSIS — M542 Cervicalgia: Secondary | ICD-10-CM | POA: Diagnosis not present

## 2019-07-25 DIAGNOSIS — M542 Cervicalgia: Secondary | ICD-10-CM | POA: Diagnosis not present

## 2019-07-25 DIAGNOSIS — M545 Low back pain: Secondary | ICD-10-CM | POA: Diagnosis not present

## 2019-07-25 DIAGNOSIS — M9903 Segmental and somatic dysfunction of lumbar region: Secondary | ICD-10-CM | POA: Diagnosis not present

## 2019-07-25 DIAGNOSIS — M9901 Segmental and somatic dysfunction of cervical region: Secondary | ICD-10-CM | POA: Diagnosis not present

## 2019-09-28 ENCOUNTER — Ambulatory Visit: Payer: BC Managed Care – PPO | Attending: Internal Medicine

## 2019-09-28 DIAGNOSIS — Z23 Encounter for immunization: Secondary | ICD-10-CM

## 2019-09-28 NOTE — Progress Notes (Signed)
   Covid-19 Vaccination Clinic  Name:  John Herman    MRN: [id number] DOB: 1975/06/25  09/28/2019  [name] was observed post Covid-19 immunization for 15 minutes without incident. He was provided with Vaccine Information Sheet and instruction to access the V-Safe system.   [name] was instructed to call 911 with any severe reactions post vaccine: Marland Kitchen Difficulty breathing  . Swelling of face and throat  . A fast heartbeat  . A bad rash all over body  . Dizziness and weakness   Immunizations Administered    Name Date Dose VIS Date Route   Pfizer COVID-19 Vaccine 09/28/2019  8:21 AM 0.3 mL 05/31/2019 Intramuscular   Manufacturer: [employer], [employer]   Lot: [id number]   NDC: [address]-9

## 2019-10-22 ENCOUNTER — Ambulatory Visit: Payer: BC Managed Care – PPO | Attending: Internal Medicine

## 2019-10-22 DIAGNOSIS — Z23 Encounter for immunization: Secondary | ICD-10-CM

## 2019-10-22 NOTE — Progress Notes (Signed)
   Covid-19 Vaccination Clinic  Name:  John Herman    MRN: [id number] DOB: [birth year]  10/22/2019  [name] was observed post Covid-19 immunization for 15 minutes without incident. He was provided with Vaccine Information Sheet and instruction to access the V-Safe system.   [name] was instructed to call 911 with any severe reactions post vaccine: Marland Kitchen Difficulty breathing  . Swelling of face and throat  . A fast heartbeat  . A bad rash all over body  . Dizziness and weakness   Immunizations Administered    Name Date Dose VIS Date Route   Pfizer COVID-19 Vaccine 10/22/2019  8:30 AM 0.3 mL 08/14/2018 Intramuscular   Manufacturer: [employer], [employer]   Lot: [id number]   NDC: [address]-9

## 2020-02-11 DIAGNOSIS — T63464A Toxic effect of venom of wasps, undetermined, initial encounter: Secondary | ICD-10-CM | POA: Diagnosis not present

## 2020-03-10 DIAGNOSIS — J069 Acute upper respiratory infection, unspecified: Secondary | ICD-10-CM | POA: Diagnosis not present

## 2020-03-10 DIAGNOSIS — R05 Cough: Secondary | ICD-10-CM | POA: Diagnosis not present

## 2020-03-31 DIAGNOSIS — Z23 Encounter for immunization: Secondary | ICD-10-CM | POA: Diagnosis not present

## 2020-03-31 DIAGNOSIS — F419 Anxiety disorder, unspecified: Secondary | ICD-10-CM | POA: Diagnosis not present

## 2020-03-31 DIAGNOSIS — Z0000 Encounter for general adult medical examination without abnormal findings: Secondary | ICD-10-CM | POA: Diagnosis not present

## 2020-03-31 DIAGNOSIS — E785 Hyperlipidemia, unspecified: Secondary | ICD-10-CM | POA: Diagnosis not present

## 2020-03-31 DIAGNOSIS — I10 Essential (primary) hypertension: Secondary | ICD-10-CM | POA: Diagnosis not present

## 2020-03-31 DIAGNOSIS — G43909 Migraine, unspecified, not intractable, without status migrainosus: Secondary | ICD-10-CM | POA: Diagnosis not present

## 2020-03-31 DIAGNOSIS — E559 Vitamin D deficiency, unspecified: Secondary | ICD-10-CM | POA: Diagnosis not present

## 2020-05-25 DIAGNOSIS — R430 Anosmia: Secondary | ICD-10-CM | POA: Diagnosis not present

## 2020-05-25 DIAGNOSIS — U071 COVID-19: Secondary | ICD-10-CM | POA: Diagnosis not present

## 2020-05-25 DIAGNOSIS — R051 Acute cough: Secondary | ICD-10-CM | POA: Diagnosis not present

## 2020-05-25 DIAGNOSIS — Z20822 Contact with and (suspected) exposure to covid-19: Secondary | ICD-10-CM | POA: Diagnosis not present

## 2020-06-01 ENCOUNTER — Ambulatory Visit: Payer: BC Managed Care – PPO | Admitting: Allergy

## 2020-06-05 ENCOUNTER — Other Ambulatory Visit: Payer: Self-pay

## 2020-06-05 ENCOUNTER — Emergency Department (HOSPITAL_COMMUNITY)
Admission: EM | Admit: 2020-06-05 | Discharge: 2020-06-06 | Disposition: A | Discharge status: Home / Self Care | Payer: BC Managed Care – PPO | Attending: Emergency Medicine | Admitting: Emergency Medicine

## 2020-06-05 ENCOUNTER — Emergency Department (HOSPITAL_COMMUNITY): Payer: BC Managed Care – PPO

## 2020-06-05 ENCOUNTER — Encounter (HOSPITAL_COMMUNITY): Payer: Self-pay | Admitting: Pharmacy Technician

## 2020-06-05 DIAGNOSIS — Z87891 Personal history of nicotine dependence: Secondary | ICD-10-CM | POA: Diagnosis not present

## 2020-06-05 DIAGNOSIS — I10 Essential (primary) hypertension: Secondary | ICD-10-CM | POA: Diagnosis not present

## 2020-06-05 DIAGNOSIS — U071 COVID-19: Secondary | ICD-10-CM | POA: Diagnosis not present

## 2020-06-05 DIAGNOSIS — R059 Cough, unspecified: Secondary | ICD-10-CM | POA: Diagnosis not present

## 2020-06-05 DIAGNOSIS — R0789 Other chest pain: Secondary | ICD-10-CM | POA: Diagnosis not present

## 2020-06-05 DIAGNOSIS — M940 Chondrocostal junction syndrome [Tietze]: Secondary | ICD-10-CM | POA: Insufficient documentation

## 2020-06-05 DIAGNOSIS — J90 Pleural effusion, not elsewhere classified: Secondary | ICD-10-CM | POA: Diagnosis not present

## 2020-06-05 DIAGNOSIS — R079 Chest pain, unspecified: Secondary | ICD-10-CM | POA: Diagnosis not present

## 2020-06-05 LAB — BASIC METABOLIC PANEL
Anion gap: 9 (ref 5–15)
BUN: 11 mg/dL (ref 6–20)
CO2: 28 mmol/L (ref 22–32)
Calcium: 9.6 mg/dL (ref 8.9–10.3)
Chloride: 103 mmol/L (ref 98–111)
Creatinine, Ser: 0.79 mg/dL (ref 0.61–1.24)
GFR, Estimated: >60 mL/min (ref >60)
Glucose, Bld: 108 mg/dL — ABNORMAL HIGH (ref 70–99)
Potassium: 4.3 mmol/L (ref 3.5–5.1)
Sodium: 140 mmol/L (ref 135–145)

## 2020-06-05 LAB — CBC
HCT: 46.8 % (ref 39.0–52.0)
Hemoglobin: 15.1 g/dL (ref 13.0–17.0)
MCH: 27.7 pg (ref 26.0–34.0)
MCHC: 32.3 g/dL (ref 30.0–36.0)
MCV: 85.9 fL (ref 80.0–100.0)
Platelets: 314 10*3/uL (ref 150–400)
RBC: 5.45 MIL/uL (ref 4.22–5.81)
RDW: 12.6 % (ref 11.5–15.5)
WBC: 6.8 10*3/uL (ref 4.0–10.5)
nRBC: 0 % (ref 0.0–0.2)

## 2020-06-05 LAB — TROPONIN I (HIGH SENSITIVITY)
Troponin I (High Sensitivity): 5 ng/L (ref <18)
Troponin I (High Sensitivity): 4 ng/L (ref <18)

## 2020-06-05 NOTE — ED Triage Notes (Signed)
Pt here with reports of increase in coughing and chest pain X3 days. Pt dx with covid 2 weeks ago. Had a televisit today and was sent here for evaluation for blood clots.

## 2020-06-06 LAB — D-DIMER, QUANTITATIVE: D-Dimer, Quant: 0.32 ug/mL-FEU (ref 0.00–0.50)

## 2020-06-06 NOTE — Discharge Instructions (Signed)
Your evaluation in the ED was reassuring.  Your D-dimer was negative and does not suggest presence of a blood clot in your lungs.  We believe that your pain is related to costochondritis.  This can be managed supportively with anti-inflammatories.  Continue follow-up with your primary doctor as needed.  Return for new or concerning symptoms.

## 2020-06-06 NOTE — ED Notes (Signed)
Pt walking ox @98 %

## 2020-06-06 NOTE — ED Provider Notes (Signed)
[facility] EMERGENCY DEPARTMENT Provider Note   CSN: [id number] Arrival date & time: 06/05/20  1425     History Chief Complaint  Patient presents with  . Chest Pain    John Herman is a 44 y.o. male.  44 year old male presents to the emergency department for evaluation of chest pain.  Was advised to present to the ED to rule out a pulmonary embolus after a televisit with his doctor.  Reports that he tested positive for Covid on 05/20/2020.  He came out of quarantine 1 week ago and began experiencing sinusitis symptoms a few days ago.  Reports nasal congestion and ear pressure, cough.  Has been experiencing some central chest discomfort with coughing lately.  Denies pain with breathing, hemoptysis, leg swelling, syncope or near syncope, fevers.  Has been taking Coricidin for symptom control.  No prior history of DVT/PE.   Chest Pain      Past Medical History:  Diagnosis Date  . Anxiety   . Chest wall pain   . Hyperlipidemia   . Hypertension   . Laceration of finger   . Migraine headache   . Sleep apnea     Patient Active Problem List   Diagnosis Date Noted  . Periodic headache syndrome, not intractable 02/17/2015  . Atypical chest pain 02/17/2015  . HTN (hypertension) 02/10/2015  . Anxiety 02/10/2015  . Sleep apnea 02/10/2015  . Migraine headache 02/10/2015  . Hyperlipidemia 02/10/2015  . Laceration of finger 02/10/2015    Past Surgical History:  Procedure Laterality Date  . APPENDECTOMY  2005  . MYRINGOTOMY WITH TUBE PLACEMENT  2008  . NASAL SEPTUM SURGERY  1994       Family History  Problem Relation Age of Onset  . Hypertension Mother   . Hypertension Father   . Alcoholism Sister   . Other Sister        HEALTHY  . Other Sister        HEALTHY    Social History   Tobacco Use  . Smoking status: Former Smoker    Quit date: 02/10/2004    Years since quitting: 16.3  Substance Use Topics  . Alcohol use: Yes    Comment: SOCIALLY  .  Drug use: No    Home Medications Prior to Admission medications   Medication Sig Start Date End Date Taking? Authorizing Provider  atorvastatin (LIPITOR) 20 MG tablet Take 1 tablet (20 mg total) by mouth daily. 04/23/15   Janetta Hora, PA-C  ibuprofen (ADVIL,MOTRIN) 200 MG tablet Take 800 mg by mouth every 6 (six) hours as needed (pain).    [provider]  nitroGLYCERIN (NITROSTAT) 0.4 MG SL tablet Place 1 tablet (0.4 mg total) under the tongue every 5 (five) minutes as needed for chest pain. 04/23/15   Janetta Hora, PA-C  SUMAtriptan (IMITREX) 100 MG tablet Take 100 mg by mouth daily as needed for migraine. May repeat in 2 hours if headache persists or recurs.    [provider]    Allergies    Patient has no known allergies.  Review of Systems   Review of Systems  Cardiovascular: Positive for chest pain.  Ten systems reviewed and are negative for acute change, except as noted in the HPI.    Physical Exam Updated Vital Signs BP 130/85   Pulse 88   Temp 98.8 F (37.1 C) (Oral)   Resp 14   Ht 6\' 3"  (1.905 m)   Wt 104.3 kg   SpO2  96%   BMI 28.75 kg/m   Physical Exam Vitals and nursing note reviewed.  Constitutional:      General: He is not in acute distress.    Appearance: He is well-developed and well-nourished. He is not diaphoretic.     Comments: Nontoxic appearing and in NAD  HENT:     Head: Normocephalic and atraumatic.  Eyes:     General: No scleral icterus.    Extraocular Movements: EOM normal.     Conjunctiva/sclera: Conjunctivae normal.  Cardiovascular:     Rate and Rhythm: Normal rate and regular rhythm.     Pulses: Normal pulses.  Pulmonary:     Effort: Pulmonary effort is normal. No respiratory distress.     Breath sounds: No stridor. No wheezing.     Comments: Respirations even and unlabored Musculoskeletal:        General: Normal range of motion.     Cervical back: Normal range of motion.  Skin:    General: Skin is  warm and dry.     Coloration: Skin is not pale.     Findings: No erythema or rash.  Neurological:     Mental Status: He is alert and oriented to person, place, and time.  Psychiatric:        Mood and Affect: Mood and affect normal.        Behavior: Behavior normal.     ED Results / Procedures / Treatments   Labs (all labs ordered are listed, but only abnormal results are displayed) Labs Reviewed  BASIC METABOLIC PANEL - Abnormal; Notable for the following components:      Result Value   Glucose, Bld 108 (*)    All other components within normal limits  CBC  D-DIMER, QUANTITATIVE (NOT AT [facility])  TROPONIN I (HIGH SENSITIVITY)  TROPONIN I (HIGH SENSITIVITY)    EKG EKG Interpretation  Date/Time:  Friday June 05 2020 14:26:40 EST Ventricular Rate:  90 PR Interval:  148 QRS Duration: 82 QT Interval:  350 QTC Calculation: 428 R Axis:   6 Text Interpretation: Normal sinus rhythm Cannot rule out Anterior infarct , age undetermined Abnormal ECG No significant change since last tracing Confirmed by Susy Frizzle [phone]) on 06/06/2020 1:33:29 AM   Radiology DG Chest 2 View  Result Date: 06/05/2020 CLINICAL DATA:  Chest pain. Cough and chest pain for 3 days. COVID 2 weeks ago. EXAM: CHEST - 2 VIEW COMPARISON:  Radiograph 12/23/2014 FINDINGS: The cardiomediastinal contours are normal. No focal airspace disease seen by radiograph. Pulmonary vasculature is normal. No consolidation, pleural effusion, or pneumothorax. No acute osseous abnormalities are seen. IMPRESSION: Negative radiographs of the chest. Electronically Signed   By: Narda Rutherford M.D.   On: 06/05/2020 15:08    Procedures Procedures (including critical care time)  Medications Ordered in ED Medications - No data to display  ED Course  I have reviewed the triage vital signs and the nursing notes.  Pertinent labs & imaging results that were available during my care of the patient were reviewed by me and  considered in my medical decision making (see chart for details).  Clinical Course as of 06/06/20 0086  Sat Jun 06, 2020  7619 Patient presenting for chest pain with coughing ONLY and sinusitis symptoms over the past few days. PCP sent patient over concern for PE. Ambulatory with sats of 98% on exertion. No leg swelling, hemoptysis, tachypnea. Work up initiated in triage is reassuring. Will add D dimer. Anticipate d/c if negative with symptoms c/w  costochondritis.  [KH]    Clinical Course User Index [KH] Antony Madura, PA-C   MDM Rules/Calculators/A&P                          Patient presents to the ED with URI/sinus symptoms. Reports Mild to moderate nasal discharge/congestion with cough for less than 10 days.  Patient is afebrile.  No concern for acute bacterial rhinosinusitis; likely viral in nature.  C/o of central chest discomfort with coughing. Sent to ED for PE evaluation; however no tachypnea, dyspnea, hypoxia, hemoptysis. D dimer negative. Pain most c/w costochondritis. Patient discharged with symptomatic treatment. Encouraged PCP follow up. Return precautions discussed and provided. Patient discharged in stable condition with no unaddressed concerns.   Final Clinical Impression(s) / ED Diagnoses Final diagnoses:  Costochondritis    Rx / DC Orders ED Discharge Orders    None       Antony Madura, PA-C 06/06/20 2339    Pollyann Savoy, MD 06/08/20 [phone]

## 2020-06-08 NOTE — Progress Notes (Signed)
New Patient Note  RE: John Herman MRN: [id number] DOB: 1975-06-29 Date of Office Visit: 06/09/2020  Referring provider: Sigmund Hazel, MD Primary care provider: Sigmund Hazel, MD  Chief Complaint: Allergies  History of Present Illness: I had the pleasure of seeing John Herman for initial evaluation at the [facility] on 06/09/2020. He is a 44 y.o. male, who is referred here by Sigmund Hazel, MD for the evaluation of food allergies.  Food:  He reports food allergy to fresh pitted fruits.   Sometimes noticing rhinorrhea after eating hot sauce or even very bland foods.   Fresh apples, plums, peaches, cherries cause perioral pruritus for the past 2 years. Symptoms resolve on its own. No other associated symptoms. Tolerates processed forms without any issues.  Past work up includes: none.  Dietary History: patient has been eating other foods including milk, eggs, peanut, treenuts, sesame, shellfish, fish, soy, wheat, meats, limited fruits and vegetables.  Rhinitis: He reports symptoms of rhinorrhea, sneezing, headaches, itchy/watery eyes, nasal congestion. Symptoms have been going on for 10+ years. The symptoms are present mainly in the spring through summer. Other triggers include exposure to fresh cut grass. Anosmia: no. Headache: yes. He has used Xyzal with fair improvement in symptoms. Sinus infections: no. Previous work up includes: 10 years ago but unsure of results. No previous AIT.  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  History of nasal polyps: no. Last eye exam: 2 years ago. History of reflux: sometimes.  Assessment and Plan: John Herman is a 44 y.o. male with: Oral allergy syndrome, subsequent encounter Perioral pruritus after eating fresh apples/plums/peaches/cherries.  No other associated symptoms.  Resolve without any intervention.  Tolerates processed forms without any issues.  Has  significant spring and summer environmental allergy symptoms.  Today's skin testing showed: Positive to grass, ragweed, weed and tree pollen, cat, dog, cockroach, dust mites. Borderline to mold #1. Negative to peach and apples.   Discussed that his food triggered oral and throat symptoms are likely caused by oral food allergy syndrome (OFAS). This is caused by cross reactivity of pollen with fresh fruits and vegetables, and nuts. Symptoms are usually localized in the form of itching and burning in mouth and throat. Very rarely it can progress to more severe symptoms. Eating foods in cooked or processed forms usually minimizes symptoms. I recommended avoidance of eating the problem foods, especially during the peak season(s). Sometimes, OFAS can induce severe throat swelling or even a systemic reaction; with such instance, I advised them to report to a local ER. A list of common pollens and food cross-reactivities was provided to the patient.   Other allergic rhinitis Rhinoconjunctivitis symptoms for 10+ years mainly in the spring and summer.  Takes Xyzal with good benefit.  Had skin testing 10 years ago but unsure of results.  No previous allergy immunotherapy.  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  Today's skin testing was: Positive to grass, ragweed, weed and tree pollen, cat, dog, cockroach, dust mites. Borderline to mold #1.  Start environmental control measures as below.  May use over the counter antihistamines such as Zyrtec (cetirizine), Claritin (loratadine), Allegra (fexofenadine), or Xyzal (levocetirizine) daily as needed.  May use Flonase (fluticasone) nasal spray 1 spray per nostril twice a day as needed for nasal congestion.   Nasal saline spray (i.e., Simply Saline) or nasal saline lavage (i.e., NeilMed) is recommended as needed  and prior to medicated nasal sprays.  May use olopatadine eye drops 0.2% once a day as needed for  itchy/watery eyes.  Gave handout on allergy immunotherapy.   Allergic conjunctivitis of both eyes  See assessment and plan as above for allergic rhinitis.  Gustatory rhinitis Noticing rhinorrhea after eating.  No specific food triggers noted.  May use Atrovent nasal spray 1-2 sprays per nostril twice a day as needed for runny nose/drainage.  Return in about 4 months (around 10/08/2020).  Meds ordered this encounter  Medications  . ipratropium (ATROVENT) 0.03 % nasal spray    Sig: Place 2 sprays into both nostrils 2 (two) times daily as needed for rhinitis.    Dispense:  30 mL    Refill:  5  . fluticasone (FLONASE) 50 MCG/ACT nasal spray    Sig: Place 1 spray into both nostrils 2 (two) times daily as needed for allergies or rhinitis.    Dispense:  16 g    Refill:  5  . Olopatadine HCl 0.2 % SOLN    Sig: Apply 1 drop to eye daily as needed (itchy/watery eyes).    Dispense:  2.5 mL    Refill:  5   Other allergy screening: Asthma: no Medication allergy: no Hymenoptera allergy: no Urticaria: a few times per year with no triggers noted.  Eczema:no History of recurrent infections suggestive of immunodeficency: no  Diagnostics: Skin Testing: Environmental allergy panel and select foods. Positive to grass, ragweed, weed and tree pollen, cat, dog, cockroach, dust mites.. Borderline to mold #1. Negative to peach and apples.  Results discussed with patient/family.  Airborne Adult Perc - 06/09/20 0929    Time Antigen Placed 4403    Allergen Manufacturer Waynette Buttery    Location Back    Number of Test 59    Panel 1 Select    1. Control-Buffer 50% Glycerol Negative    2. Control-Histamine 1 mg/ml 2+    3. Albumin saline Negative    4. Bahia 4+    5. [place] 3+    6. Johnson Negative    7. Kentucky Blue 4+    8. Meadow Fescue 3+    9. Perennial Rye 3+    10. Sweet Vernal 4+    11. Timothy 4+    12. Cocklebur Negative    13. Burweed Marshelder Negative    14. Ragweed, short 4+     15. Ragweed, Giant 2+    16. Plantain,  English 2+    17. Lamb's Quarters Negative    18. Sheep Sorrell Negative    19. Rough Pigweed Negative    20. Marsh Elder, Rough Negative    21. Mugwort, Common Negative    22. Ash mix 3+    23. Birch mix 4+    24. Beech American 3+    25. Box, Elder 2+    26. Cedar, red Negative    27. Cottonwood, [place] --   +/-   28. Elm mix Negative    29. Hickory 4+    30. Maple mix 2+    31. Oak, [place] mix 4+    32. Pecan Pollen 4+    33. Pine mix Negative    34. Sycamore Eastern 2+    35. Walnut, Black Pollen 3+    36. Alternaria alternata Negative    37. Cladosporium Herbarum Negative    38. Aspergillus mix Negative    39. Penicillium mix Negative    40. Bipolaris sorokiniana (Helminthosporium) Negative  41. Drechslera spicifera (Curvularia) Negative    42. Mucor plumbeus Negative    43. Fusarium moniliforme Negative    44. Aureobasidium pullulans (pullulara) Negative    45. Rhizopus oryzae Negative    46. Botrytis cinera Negative    47. Epicoccum nigrum Negative    48. Phoma betae Negative    49. Candida Albicans Negative    50. Trichophyton mentagrophytes Negative    51. Mite, D Farinae  5,000 AU/ml Negative    52. Mite, D Pteronyssinus  5,000 AU/ml Negative    53. Cat Hair 10,000 BAU/ml Negative    54.  Dog Epithelia Negative    55. Mixed Feathers Negative    56. Horse Epithelia Negative    57. Cockroach, German Negative    58. Mouse Negative    59. Tobacco Leaf Negative          Intradermal - 06/09/20 1002    Time Antigen Placed 1002    Allergen Manufacturer Waynette Buttery    Location Arm    Intradermal Select    Control Negative    [place] Omitted    Johnson 4+    7 Grass Omitted    Ragweed mix Omitted    Weed mix Omitted    Tree mix Omitted    Mold 1 --   +/-   Mold 2 Negative    Mold 3 Negative    Mold 4 Negative    Cat 3+    Dog 2+    Cockroach 2+    Mite mix 2+    Other Omitted          Food Adult Perc -  06/09/20 0900    Time Antigen Placed 0930    Allergen Manufacturer Greer    Location Back    Number of allergen test 2    58. Apple Negative    59. Peach Negative           Past Medical History: Patient Active Problem List   Diagnosis Date Noted  . Oral allergy syndrome, subsequent encounter 06/09/2020  . Other allergic rhinitis 06/09/2020  . Allergic conjunctivitis of both eyes 06/09/2020  . Gustatory rhinitis 06/09/2020  . Periodic headache syndrome, not intractable 02/17/2015  . Atypical chest pain 02/17/2015  . HTN (hypertension) 02/10/2015  . Anxiety 02/10/2015  . Sleep apnea 02/10/2015  . Migraine headache 02/10/2015  . Hyperlipidemia 02/10/2015  . Laceration of finger 02/10/2015   Past Medical History:  Diagnosis Date  . Anxiety   . Asthma   . Chest wall pain   . Hyperlipidemia   . Hypertension   . Laceration of finger   . Migraine headache   . Sleep apnea    Past Surgical History: Past Surgical History:  Procedure Laterality Date  . ADENOIDECTOMY    . APPENDECTOMY  2005  . MYRINGOTOMY WITH TUBE PLACEMENT  2008  . NASAL SEPTUM SURGERY  1994  . TYMPANOSTOMY TUBE PLACEMENT     Medication List:  Current Outpatient Medications  Medication Sig Dispense Refill  . ALPRAZolam (XANAX) 0.5 MG tablet Take 0.5 mg by mouth daily as needed.    Marland Kitchen atorvastatin (LIPITOR) 20 MG tablet Take 1 tablet (20 mg total) by mouth daily. 30 tablet 11  . benzonatate (TESSALON) 200 MG capsule Take by mouth.    . Cholecalciferol (VITAMIN D) 50 MCG (2000 UT) tablet 1 tablet    . Clobetasol Propionate 0.05 % shampoo Apply topically.    Marland Kitchen ibuprofen (ADVIL,MOTRIN) 200 MG tablet Take 800  mg by mouth every 6 (six) hours as needed (pain).    Marland Kitchen sertraline (ZOLOFT) 50 MG tablet Take 50 mg by mouth daily.    . SUMAtriptan (IMITREX) 100 MG tablet Take 100 mg by mouth daily as needed for migraine. May repeat in 2 hours if headache persists or recurs.    . triamcinolone ointment (KENALOG) 0.5 %  Apply topically.    . fluticasone (FLONASE) 50 MCG/ACT nasal spray Place 1 spray into both nostrils 2 (two) times daily as needed for allergies or rhinitis. 16 g 5  . ipratropium (ATROVENT) 0.03 % nasal spray Place 2 sprays into both nostrils 2 (two) times daily as needed for rhinitis. 30 mL 5  . nitroGLYCERIN (NITROSTAT) 0.4 MG SL tablet Place 1 tablet (0.4 mg total) under the tongue every 5 (five) minutes as needed for chest pain. 25 tablet 3  . Olopatadine HCl 0.2 % SOLN Apply 1 drop to eye daily as needed (itchy/watery eyes). 2.5 mL 5   No current facility-administered medications for this visit.   Allergies: No Known Allergies Social History: Social History   Socioeconomic History  . Marital status: Divorced    Spouse name: Not on file  . Number of children: Not on file  . Years of education: Not on file  . Highest education level: Not on file  Occupational History  . Not on file  Tobacco Use  . Smoking status: Former Smoker    Quit date: 02/10/2004    Years since quitting: 16.3  . Smokeless tobacco: Former Clinical biochemist  . Vaping Use: Never used  Substance and Sexual Activity  . Alcohol use: Yes    Comment: SOCIALLY  . Drug use: No  . Sexual activity: Yes  Other Topics Concern  . Not on file  Social History Narrative  . Not on file   Social Determinants of Health   Financial Resource Strain: Not on file  Food Insecurity: Not on file  Transportation Needs: Not on file  Physical Activity: Not on file  Stress: Not on file  Social Connections: Not on file   Lives in an old house. Smoking: quit in 2005 Occupation: flight attendant  Environmental History: Water Damage/mildew in the house: no Carpet in the family room: no Carpet in the bedroom: no Heating: electric Cooling: central Pet: no  Family History: Family History  Problem Relation Age of Onset  . Hypertension Mother   . Hypertension Father   . Alcoholism Sister   . Other Sister        HEALTHY   . Other Sister        HEALTHY   Problem                               Relation Asthma                                   No  Eczema                                No  Food allergy                          No Allergic rhino conjunctivitis     Son   Review of Systems  Constitutional: Negative  for appetite change, chills, fever and unexpected weight change.  HENT: Positive for rhinorrhea. Negative for congestion.   Eyes: Negative for itching.  Respiratory: Negative for cough, chest tightness, shortness of breath and wheezing.   Cardiovascular: Negative for chest pain.  Gastrointestinal: Negative for abdominal pain.  Genitourinary: Negative for difficulty urinating.  Skin: Negative for rash.  Allergic/Immunologic: Positive for environmental allergies.  Neurological: Negative for headaches.   Objective: BP 138/80 (BP Location: Right Arm, Patient Position: Sitting, Cuff Size: Normal)   Pulse 81   Temp (!) 97.4 F (36.3 C) (Temporal)   Resp 18   Ht 6' 2.8" (1.9 m)   Wt 239 lb 12.8 oz (108.8 kg)   SpO2 97%   BMI 30.13 kg/m  Body mass index is 30.13 kg/m. Physical Exam Vitals and nursing note reviewed.  Constitutional:      Appearance: Normal appearance. He is well-developed.  HENT:     Head: Normocephalic and atraumatic.     Right Ear: External ear normal.     Left Ear: External ear normal.     Nose: Nose normal.     Mouth/Throat:     Mouth: Mucous membranes are moist.     Pharynx: Oropharynx is clear.  Eyes:     Conjunctiva/sclera: Conjunctivae normal.  Cardiovascular:     Rate and Rhythm: Normal rate and regular rhythm.     Heart sounds: Normal heart sounds. No murmur heard. No friction rub. No gallop.   Pulmonary:     Effort: Pulmonary effort is normal.     Breath sounds: Normal breath sounds. No wheezing, rhonchi or rales.  Abdominal:     Palpations: Abdomen is soft.  Musculoskeletal:     Cervical back: Neck supple.  Skin:    General: Skin is warm.      Findings: No rash.  Neurological:     Mental Status: He is alert and oriented to person, place, and time.  Psychiatric:        Behavior: Behavior normal.    The plan was reviewed with the patient/family, and all questions/concerned were addressed.  It was my pleasure to see John Herman today and participate in his care. Please feel free to contact me with any questions or concerns.  Sincerely,  Wyline MoodYoon Perle Brickhouse, DO Allergy & Immunology  [facility] [place] office: [phone] [facility] office: [phone]

## 2020-06-09 ENCOUNTER — Other Ambulatory Visit: Payer: Self-pay

## 2020-06-09 ENCOUNTER — Ambulatory Visit (INDEPENDENT_AMBULATORY_CARE_PROVIDER_SITE_OTHER): Payer: BC Managed Care – PPO | Admitting: Allergy

## 2020-06-09 ENCOUNTER — Encounter: Payer: Self-pay | Admitting: Allergy

## 2020-06-09 VITALS — BP 138/80 | HR 81 | Temp 97.4°F | Resp 18 | Ht 74.8 in | Wt 239.8 lb

## 2020-06-09 DIAGNOSIS — J3089 Other allergic rhinitis: Secondary | ICD-10-CM

## 2020-06-09 DIAGNOSIS — T781XXD Other adverse food reactions, not elsewhere classified, subsequent encounter: Secondary | ICD-10-CM | POA: Insufficient documentation

## 2020-06-09 DIAGNOSIS — T7819XD Other adverse food reactions, not elsewhere classified, subsequent encounter: Secondary | ICD-10-CM | POA: Insufficient documentation

## 2020-06-09 DIAGNOSIS — H1013 Acute atopic conjunctivitis, bilateral: Secondary | ICD-10-CM

## 2020-06-09 DIAGNOSIS — J310 Chronic rhinitis: Secondary | ICD-10-CM | POA: Diagnosis not present

## 2020-06-09 MED ORDER — IPRATROPIUM BROMIDE 0.03 % NA SOLN: 2.0000 | Freq: Two times a day (BID) | NASAL | 5 refills | Status: DC | PRN

## 2020-06-09 MED ORDER — OLOPATADINE HCL 0.2 % OP SOLN: 1.0000 [drp] | Freq: Every day | OPHTHALMIC | 5 refills | Status: DC | PRN

## 2020-06-09 MED ORDER — FLUTICASONE PROPIONATE 50 MCG/ACT NA SUSP: 1.0000 | Freq: Two times a day (BID) | NASAL | 5 refills | Status: DC | PRN

## 2020-06-09 NOTE — Patient Instructions (Addendum)
Today's skin testing showed: Positive to grass, ragweed, weed and tree pollen, cat, dog, cockroach, dust mites.. Borderline to mold #1. Negative to peach and apples.  Results given.  Foods:  Discussed that his food triggered oral and throat symptoms are likely caused by oral food allergy syndrome (OFAS). This is caused by cross reactivity of pollen with fresh fruits and vegetables, and nuts. Symptoms are usually localized in the form of itching and burning in mouth and throat. Very rarely it can progress to more severe symptoms. Eating foods in cooked or processed forms usually minimizes symptoms. I recommended avoidance of eating the problem foods, especially during the peak season(s). Sometimes, OFAS can induce severe throat swelling or even a systemic reaction; with such instance, I advised them to report to a local ER. A list of common pollens and food cross-reactivities was provided to the patient.   Gustatory rhinitis:  May use atrovent nasal spray 1-2 sprays per nostril twice a day as needed for runny nose/drainage.  Environmental allergies  Start environmental control measures as below.  May use over the counter antihistamines such as Zyrtec (cetirizine), Claritin (loratadine), Allegra (fexofenadine), or Xyzal (levocetirizine) daily as needed.  May use Flonase (fluticasone) nasal spray 1 spray per nostril twice a day as needed for nasal congestion.   Nasal saline spray (i.e., Simply Saline) or nasal saline lavage (i.e., NeilMed) is recommended as needed and prior to medicated nasal sprays.  May use olopatadine eye drops 0.2% once a day as needed for itchy/watery eyes.  Had a detailed discussion with patient/family that clinical history is suggestive of allergic rhinitis, and may benefit from allergy immunotherapy (AIT). Discussed in detail regarding the dosing, schedule, side effects (mild to moderate local allergic reaction and rarely systemic allergic reactions including  anaphylaxis), and benefits (significant improvement in nasal symptoms, seasonal flares of asthma) of immunotherapy with the patient. There is significant time commitment involved with allergy shots, which includes weekly immunotherapy injections for first 9-12 months and then biweekly to monthly injections for 3-5 years. Read about allergy injections.   Follow up in 4 months or sooner if needed.   Reducing Pollen Exposure . Pollen seasons: trees (spring), grass (summer) and ragweed/weeds (fall). Marland Kitchen Keep windows closed in your home and car to lower pollen exposure.  Lilian Kapur air conditioning in the bedroom and throughout the house if possible.  . Avoid going out in dry windy days - especially early morning. . Pollen counts are highest between 5 - 10 AM and on dry, hot and windy days.  . Save outside activities for late afternoon or after a heavy rain, when pollen levels are lower.  . Avoid mowing of grass if you have grass pollen allergy. Marland Kitchen Be aware that pollen can also be transported indoors on people and pets.  . Dry your clothes in an automatic dryer rather than hanging them outside where they might collect pollen.  . Rinse hair and eyes before bedtime. Pet Allergen Avoidance: . Contrary to popular opinion, there are no "hypoallergenic" breeds of dogs or cats. That is because people are not allergic to an animal's hair, but to an allergen found in the animal's saliva, dander (dead skin flakes) or urine. Pet allergy symptoms typically occur within minutes. For some people, symptoms can build up and become most severe 8 to 12 hours after contact with the animal. People with severe allergies can experience reactions in public places if dander has been transported on the pet owners' clothing. Marland Kitchen Keeping an Educational psychologist  outdoors is only a partial solution, since homes with pets in the yard still have higher concentrations of animal allergens. . Before getting a pet, ask your allergist to determine if you are  allergic to animals. If your pet is already considered part of your family, try to minimize contact and keep the pet out of the bedroom and other rooms where you spend a great deal of time. . As with dust mites, vacuum carpets often or replace carpet with a hardwood floor, tile or linoleum. . High-efficiency particulate air (HEPA) cleaners can reduce allergen levels over time. . While dander and saliva are the source of cat and dog allergens, urine is the source of allergens from rabbits, hamsters, mice and [place] pigs; so ask a non-allergic family member to clean the animal's cage. . If you have a pet allergy, talk to your allergist about the potential for allergy immunotherapy (allergy shots). This strategy can often provide long-term relief. Control of House Dust Mite Allergen . Dust mite allergens are a common trigger of allergy and asthma symptoms. While they can be found throughout the house, these microscopic creatures thrive in warm, humid environments such as bedding, upholstered furniture and carpeting. . Because so much time is spent in the bedroom, it is essential to reduce mite levels there.  . Encase pillows, mattresses, and box springs in special allergen-proof fabric covers or airtight, zippered plastic covers.  . Bedding should be washed weekly in hot water (130 F) and dried in a hot dryer. Allergen-proof covers are available for comforters and pillows that can't be regularly washed.  Reyes Ivan the allergy-proof covers every few months. Minimize clutter in the bedroom. Keep pets out of the bedroom.  Marland Kitchen Keep humidity less than 50% by using a dehumidifier or air conditioning. You can buy a humidity measuring device called a hygrometer to monitor this.  . If possible, replace carpets with hardwood, linoleum, or washable area rugs. If that's not possible, vacuum frequently with a vacuum that has a HEPA filter. . Remove all upholstered furniture and non-washable window drapes from the  bedroom. . Remove all non-washable stuffed toys from the bedroom.  Wash stuffed toys weekly. Cockroach Allergen Avoidance Cockroaches are often found in the homes of densely populated urban areas, schools or commercial buildings, but these creatures can lurk almost anywhere. This does not mean that you have a dirty house or living area. . Block all areas where roaches can enter the home. This includes crevices, wall cracks and windows.  . Cockroaches need water to survive, so fix and seal all leaky faucets and pipes. Have an exterminator go through the house when your family and pets are gone to eliminate any remaining roaches. Marland Kitchen Keep food in lidded containers and put pet food dishes away after your pets are done eating. Vacuum and sweep the floor after meals, and take out garbage and recyclables. Use lidded garbage containers in the kitchen. Wash dishes immediately after use and clean under stoves, refrigerators or toasters where crumbs can accumulate. Wipe off the stove and other kitchen surfaces and cupboards regularly. Mold Control . Mold and fungi can grow on a variety of surfaces provided certain temperature and moisture conditions exist.  . Outdoor molds grow on plants, decaying vegetation and soil. The major outdoor mold, Alternaria and Cladosporium, are found in very high numbers during hot and dry conditions. Generally, a late summer - fall peak is seen for common outdoor fungal spores. Rain will temporarily lower outdoor mold spore count,  but counts rise rapidly when the rainy period ends. . The most important indoor molds are Aspergillus and Penicillium. Dark, humid and poorly ventilated basements are ideal sites for mold growth. The next most common sites of mold growth are the bathroom and the kitchen. Outdoor (Seasonal) Mold Control . Use air conditioning and keep windows closed. . Avoid exposure to decaying vegetation. Marland Kitchen Avoid leaf raking. . Avoid grain handling. . Consider wearing a  face mask if working in moldy areas.  Indoor (Perennial) Mold Control  . Maintain humidity below 50%. . Get rid of mold growth on hard surfaces with water, detergent and, if necessary, 5% bleach (do not mix with other cleaners). Then dry the area completely. If mold covers an area more than 10 square feet, consider hiring an indoor environmental professional. . For clothing, washing with soap and water is best. If moldy items cannot be cleaned and dried, throw them away. . Remove sources e.g. contaminated carpets. . Repair and seal leaking roofs or pipes. Using dehumidifiers in damp basements may be helpful, but empty the water and clean units regularly to prevent mildew from forming. All rooms, especially basements, bathrooms and kitchens, require ventilation and cleaning to deter mold and mildew growth. Avoid carpeting on concrete or damp floors, and storing items in damp areas.

## 2020-06-09 NOTE — Assessment & Plan Note (Signed)
Rhinoconjunctivitis symptoms for 10+ years mainly in the spring and summer.  Takes Xyzal with good benefit.  Had skin testing 10 years ago but unsure of results.  No previous allergy immunotherapy.  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  Today's skin testing was: Positive to grass, ragweed, weed and tree pollen, cat, dog, cockroach, dust mites. Borderline to mold #1.  Start environmental control measures as below.  May use over the counter antihistamines such as Zyrtec (cetirizine), Claritin (loratadine), Allegra (fexofenadine), or Xyzal (levocetirizine) daily as needed.  May use Flonase (fluticasone) nasal spray 1 spray per nostril twice a day as needed for nasal congestion.   Nasal saline spray (i.e., Simply Saline) or nasal saline lavage (i.e., NeilMed) is recommended as needed and prior to medicated nasal sprays.  May use olopatadine eye drops 0.2% once a day as needed for itchy/watery eyes.  Gave handout on allergy immunotherapy.

## 2020-06-09 NOTE — Assessment & Plan Note (Signed)
Noticing rhinorrhea after eating.  No specific food triggers noted.  May use Atrovent nasal spray 1-2 sprays per nostril twice a day as needed for runny nose/drainage.

## 2020-06-09 NOTE — Assessment & Plan Note (Signed)
   See assessment and plan as above for allergic rhinitis.  

## 2020-06-09 NOTE — Assessment & Plan Note (Signed)
Perioral pruritus after eating fresh apples/plums/peaches/cherries.  No other associated symptoms.  Resolve without any intervention.  Tolerates processed forms without any issues.  Has significant spring and summer environmental allergy symptoms.  Today's skin testing showed: Positive to grass, ragweed, weed and tree pollen, cat, dog, cockroach, dust mites. Borderline to mold #1. Negative to peach and apples.   Discussed that his food triggered oral and throat symptoms are likely caused by oral food allergy syndrome (OFAS). This is caused by cross reactivity of pollen with fresh fruits and vegetables, and nuts. Symptoms are usually localized in the form of itching and burning in mouth and throat. Very rarely it can progress to more severe symptoms. Eating foods in cooked or processed forms usually minimizes symptoms. I recommended avoidance of eating the problem foods, especially during the peak season(s). Sometimes, OFAS can induce severe throat swelling or even a systemic reaction; with such instance, I advised them to report to a local ER. A list of common pollens and food cross-reactivities was provided to the patient.

## 2020-07-08 DIAGNOSIS — M542 Cervicalgia: Secondary | ICD-10-CM | POA: Diagnosis not present

## 2020-07-08 DIAGNOSIS — M62830 Muscle spasm of back: Secondary | ICD-10-CM | POA: Diagnosis not present

## 2020-07-08 DIAGNOSIS — M9903 Segmental and somatic dysfunction of lumbar region: Secondary | ICD-10-CM | POA: Diagnosis not present

## 2020-07-08 DIAGNOSIS — M9901 Segmental and somatic dysfunction of cervical region: Secondary | ICD-10-CM | POA: Diagnosis not present

## 2020-07-13 DIAGNOSIS — L218 Other seborrheic dermatitis: Secondary | ICD-10-CM | POA: Diagnosis not present

## 2020-07-13 DIAGNOSIS — L718 Other rosacea: Secondary | ICD-10-CM | POA: Diagnosis not present

## 2020-08-17 DIAGNOSIS — G4733 Obstructive sleep apnea (adult) (pediatric): Secondary | ICD-10-CM | POA: Diagnosis not present

## 2020-09-14 DIAGNOSIS — G4733 Obstructive sleep apnea (adult) (pediatric): Secondary | ICD-10-CM | POA: Diagnosis not present

## 2020-10-08 ENCOUNTER — Ambulatory Visit: Payer: BC Managed Care – PPO | Admitting: Allergy

## 2020-10-08 DIAGNOSIS — J309 Allergic rhinitis, unspecified: Secondary | ICD-10-CM

## 2020-10-08 NOTE — Progress Notes (Deleted)
Follow Up Note  RE: John Herman MRN: [id number] DOB: March 27, 1976 Date of Office Visit: 10/08/2020  Referring provider: Sigmund Hazel, MD Primary care provider: Sigmund Hazel, MD  Chief Complaint: No chief complaint on file.  History of Present Illness: I had the pleasure of seeing John Herman for a follow up visit at the [facility] on 10/08/2020. He is a 45 y.o. male, who is being followed for allergic rhinoconjunctivitis, oral allergy syndrome and gustatory rhinitis. His previous allergy office visit was on 06/09/2020 with Dr. Selena Batten. Today is a regular follow up visit.  Oral allergy syndrome, subsequent encounter Perioral pruritus after eating fresh apples/plums/peaches/cherries.  No other associated symptoms.  Resolve without any intervention.  Tolerates processed forms without any issues.  Has significant spring and summer environmental allergy symptoms.  Today's skin testing showed: Positive to grass, ragweed, weed and tree pollen, cat, dog, cockroach, dust mites. Borderline to mold #1. Negative to peach and apples.   Discussed that his food triggered oral and throat symptoms are likely caused by oral food allergy syndrome (OFAS). This is caused by cross reactivity of pollen with fresh fruits and vegetables, and nuts. Symptoms are usually localized in the form of itching and burning in mouth and throat. Very rarely it can progress to more severe symptoms. Eating foods in cooked or processed forms usually minimizes symptoms. I recommended avoidance of eating the problem foods, especially during the peak season(s). Sometimes, OFAS can induce severe throat swelling or even a systemic reaction; with such instance, I advised them to report to a local ER. A list of common pollens and food cross-reactivities was provided to the patient.   Other allergic rhinitis Rhinoconjunctivitis symptoms for 10+ years mainly in the spring and summer.  Takes Xyzal with good benefit.  Had skin  testing 10 years ago but unsure of results.  No previous allergy immunotherapy.  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  Today's skin testing was: Positive to grass, ragweed, weed and tree pollen, cat, dog, cockroach, dust mites. Borderline to mold #1.  Start environmental control measures as below.  May use over the counter antihistamines such as Zyrtec (cetirizine), Claritin (loratadine), Allegra (fexofenadine), or Xyzal (levocetirizine) daily as needed.  May use Flonase (fluticasone) nasal spray 1 spray per nostril twice a day as needed for nasal congestion.   Nasal saline spray (i.e., Simply Saline) or nasal saline lavage (i.e., NeilMed) is recommended as needed and prior to medicated nasal sprays.  May use olopatadine eye drops 0.2% once a day as needed for itchy/watery eyes.  Gave handout on allergy immunotherapy.   Allergic conjunctivitis of both eyes  See assessment and plan as above for allergic rhinitis.  Gustatory rhinitis Noticing rhinorrhea after eating.  No specific food triggers noted.  May use Atrovent nasal spray 1-2 sprays per nostril twice a day as needed for runny nose/drainage.  Return in about 4 months (around 10/08/2020).  Assessment and Plan: John Herman is a 45 y.o. male with: No problem-specific Assessment & Plan notes found for this encounter.  No follow-ups on file.  No orders of the defined types were placed in this encounter.  Lab Orders  No laboratory test(s) ordered today    Diagnostics: Spirometry:  Tracings reviewed. His effort: {Blank single:19197::"Good reproducible efforts.","It was hard to get consistent efforts and there is a question as to whether this reflects a maximal maneuver.","Poor effort, data can not be interpreted."} FVC: ***L FEV1: ***L, ***%  predicted FEV1/FVC ratio: ***% Interpretation: {Blank single:19197::"Spirometry consistent with mild obstructive disease","Spirometry  consistent with moderate obstructive disease","Spirometry consistent with severe obstructive disease","Spirometry consistent with possible restrictive disease","Spirometry consistent with mixed obstructive and restrictive disease","Spirometry uninterpretable due to technique","Spirometry consistent with normal pattern","No overt abnormalities noted given today's efforts"}.  Please see scanned spirometry results for details.  Skin Testing: {Blank single:19197::"Select foods","Environmental allergy panel","Environmental allergy panel and select foods","Food allergy panel","None","Deferred due to recent antihistamines use"}. Positive test to: ***. Negative test to: ***.  Results discussed with patient/family.   Medication List:  Current Outpatient Medications  Medication Sig Dispense Refill  . ALPRAZolam (XANAX) 0.5 MG tablet Take 0.5 mg by mouth daily as needed.    Marland Kitchen atorvastatin (LIPITOR) 20 MG tablet Take 1 tablet (20 mg total) by mouth daily. 30 tablet 11  . benzonatate (TESSALON) 200 MG capsule Take by mouth.    . Cholecalciferol (VITAMIN D) 50 MCG (2000 UT) tablet 1 tablet    . Clobetasol Propionate 0.05 % shampoo Apply topically.    . fluticasone (FLONASE) 50 MCG/ACT nasal spray Place 1 spray into both nostrils 2 (two) times daily as needed for allergies or rhinitis. 16 g 5  . ibuprofen (ADVIL,MOTRIN) 200 MG tablet Take 800 mg by mouth every 6 (six) hours as needed (pain).    Marland Kitchen ipratropium (ATROVENT) 0.03 % nasal spray Place 2 sprays into both nostrils 2 (two) times daily as needed for rhinitis. 30 mL 5  . nitroGLYCERIN (NITROSTAT) 0.4 MG SL tablet Place 1 tablet (0.4 mg total) under the tongue every 5 (five) minutes as needed for chest pain. 25 tablet 3  . Olopatadine HCl 0.2 % SOLN Apply 1 drop to eye daily as needed (itchy/watery eyes). 2.5 mL 5  . sertraline (ZOLOFT) 50 MG tablet Take 50 mg by mouth daily.    . SUMAtriptan (IMITREX) 100 MG tablet Take 100 mg by mouth daily as needed for  migraine. May repeat in 2 hours if headache persists or recurs.    . triamcinolone ointment (KENALOG) 0.5 % Apply topically.     No current facility-administered medications for this visit.   Allergies: No Known Allergies I reviewed his past medical history, social history, family history, and environmental history and no significant changes have been reported from his previous visit.  Review of Systems  Constitutional: Negative for appetite change, chills, fever and unexpected weight change.  HENT: Positive for rhinorrhea. Negative for congestion.   Eyes: Negative for itching.  Respiratory: Negative for cough, chest tightness, shortness of breath and wheezing.   Cardiovascular: Negative for chest pain.  Gastrointestinal: Negative for abdominal pain.  Genitourinary: Negative for difficulty urinating.  Skin: Negative for rash.  Allergic/Immunologic: Positive for environmental allergies.  Neurological: Negative for headaches.   Objective: There were no vitals taken for this visit. There is no height or weight on file to calculate BMI. Physical Exam Vitals and nursing note reviewed.  Constitutional:      Appearance: Normal appearance. He is well-developed.  HENT:     Head: Normocephalic and atraumatic.     Right Ear: Tympanic membrane and external ear normal.     Left Ear: Tympanic membrane and external ear normal.     Nose: Nose normal.     Mouth/Throat:     Mouth: Mucous membranes are moist.     Pharynx: Oropharynx is clear.  Eyes:     Conjunctiva/sclera: Conjunctivae normal.  Cardiovascular:     Rate and Rhythm: Normal rate and regular rhythm.  Heart sounds: Normal heart sounds. No murmur heard. No friction rub. No gallop.   Pulmonary:     Effort: Pulmonary effort is normal.     Breath sounds: Normal breath sounds. No wheezing, rhonchi or rales.  Musculoskeletal:     Cervical back: Neck supple.  Skin:    General: Skin is warm.     Findings: No rash.  Neurological:      Mental Status: He is alert and oriented to person, place, and time.  Psychiatric:        Behavior: Behavior normal.    Previous notes and tests were reviewed. The plan was reviewed with the patient/family, and all questions/concerned were addressed.  It was my pleasure to see John Herman today and participate in his care. Please feel free to contact me with any questions or concerns.  Sincerely,  Wyline Mood, DO Allergy & Immunology  [facility] office: [phone] [facility] office: [phone]

## 2020-10-15 DIAGNOSIS — G4733 Obstructive sleep apnea (adult) (pediatric): Secondary | ICD-10-CM | POA: Diagnosis not present

## 2020-11-14 DIAGNOSIS — G4733 Obstructive sleep apnea (adult) (pediatric): Secondary | ICD-10-CM | POA: Diagnosis not present

## 2020-12-15 DIAGNOSIS — G4733 Obstructive sleep apnea (adult) (pediatric): Secondary | ICD-10-CM | POA: Diagnosis not present

## 2020-12-22 DIAGNOSIS — G4733 Obstructive sleep apnea (adult) (pediatric): Secondary | ICD-10-CM | POA: Diagnosis not present

## 2020-12-30 ENCOUNTER — Other Ambulatory Visit: Payer: Self-pay | Admitting: Allergy

## 2020-12-31 NOTE — Telephone Encounter (Signed)
Patient was last seen in December of 2021 and was a no show for their appointment in April of 2022. Patient will need an appointment for further refills.

## 2021-01-14 DIAGNOSIS — M25519 Pain in unspecified shoulder: Secondary | ICD-10-CM | POA: Diagnosis not present

## 2021-01-14 DIAGNOSIS — G4733 Obstructive sleep apnea (adult) (pediatric): Secondary | ICD-10-CM | POA: Diagnosis not present

## 2021-01-14 DIAGNOSIS — I10 Essential (primary) hypertension: Secondary | ICD-10-CM | POA: Diagnosis not present

## 2021-01-15 DIAGNOSIS — F4321 Adjustment disorder with depressed mood: Secondary | ICD-10-CM | POA: Diagnosis not present

## 2021-01-15 DIAGNOSIS — I10 Essential (primary) hypertension: Secondary | ICD-10-CM | POA: Diagnosis not present

## 2021-01-15 DIAGNOSIS — R0789 Other chest pain: Secondary | ICD-10-CM | POA: Diagnosis not present

## 2021-01-15 DIAGNOSIS — R8290 Unspecified abnormal findings in urine: Secondary | ICD-10-CM | POA: Diagnosis not present

## 2021-01-15 DIAGNOSIS — R319 Hematuria, unspecified: Secondary | ICD-10-CM | POA: Diagnosis not present

## 2021-02-01 DIAGNOSIS — I10 Essential (primary) hypertension: Secondary | ICD-10-CM | POA: Diagnosis not present

## 2021-02-14 DIAGNOSIS — G4733 Obstructive sleep apnea (adult) (pediatric): Secondary | ICD-10-CM | POA: Diagnosis not present

## 2021-02-15 DIAGNOSIS — I10 Essential (primary) hypertension: Secondary | ICD-10-CM | POA: Diagnosis not present

## 2021-02-15 DIAGNOSIS — E785 Hyperlipidemia, unspecified: Secondary | ICD-10-CM | POA: Diagnosis not present

## 2021-02-15 DIAGNOSIS — R0789 Other chest pain: Secondary | ICD-10-CM | POA: Diagnosis not present

## 2021-02-15 DIAGNOSIS — F419 Anxiety disorder, unspecified: Secondary | ICD-10-CM | POA: Diagnosis not present

## 2021-02-24 DIAGNOSIS — F411 Generalized anxiety disorder: Secondary | ICD-10-CM | POA: Diagnosis not present

## 2021-03-03 DIAGNOSIS — F411 Generalized anxiety disorder: Secondary | ICD-10-CM | POA: Diagnosis not present

## 2021-03-17 DIAGNOSIS — G4733 Obstructive sleep apnea (adult) (pediatric): Secondary | ICD-10-CM | POA: Diagnosis not present

## 2021-03-17 DIAGNOSIS — F411 Generalized anxiety disorder: Secondary | ICD-10-CM | POA: Diagnosis not present

## 2021-03-23 ENCOUNTER — Ambulatory Visit: Payer: BC Managed Care – PPO | Admitting: Family Medicine

## 2021-03-24 DIAGNOSIS — G4733 Obstructive sleep apnea (adult) (pediatric): Secondary | ICD-10-CM | POA: Diagnosis not present

## 2021-04-16 DIAGNOSIS — G4733 Obstructive sleep apnea (adult) (pediatric): Secondary | ICD-10-CM | POA: Diagnosis not present

## 2021-04-20 ENCOUNTER — Encounter: Payer: Self-pay | Admitting: Cardiovascular Disease

## 2021-04-20 NOTE — Progress Notes (Signed)
This encounter was created in error - please disregard.

## 2021-04-21 ENCOUNTER — Encounter: Payer: BC Managed Care – PPO | Admitting: Cardiovascular Disease

## 2021-05-17 DIAGNOSIS — G4733 Obstructive sleep apnea (adult) (pediatric): Secondary | ICD-10-CM | POA: Diagnosis not present

## 2021-06-10 ENCOUNTER — Other Ambulatory Visit: Payer: Self-pay | Admitting: Allergy

## 2021-06-16 DIAGNOSIS — G4733 Obstructive sleep apnea (adult) (pediatric): Secondary | ICD-10-CM | POA: Diagnosis not present

## 2021-06-23 DIAGNOSIS — E669 Obesity, unspecified: Secondary | ICD-10-CM | POA: Diagnosis not present

## 2021-06-23 DIAGNOSIS — E785 Hyperlipidemia, unspecified: Secondary | ICD-10-CM | POA: Diagnosis not present

## 2021-06-23 DIAGNOSIS — I10 Essential (primary) hypertension: Secondary | ICD-10-CM | POA: Diagnosis not present

## 2021-06-23 DIAGNOSIS — F419 Anxiety disorder, unspecified: Secondary | ICD-10-CM | POA: Diagnosis not present

## 2021-06-29 DIAGNOSIS — M2602 Maxillary hypoplasia: Secondary | ICD-10-CM | POA: Diagnosis not present

## 2021-06-29 DIAGNOSIS — M2619 Other specified anomalies of jaw-cranial base relationship: Secondary | ICD-10-CM | POA: Diagnosis not present

## 2021-06-29 DIAGNOSIS — G4733 Obstructive sleep apnea (adult) (pediatric): Secondary | ICD-10-CM | POA: Diagnosis not present

## 2021-06-30 DIAGNOSIS — G4733 Obstructive sleep apnea (adult) (pediatric): Secondary | ICD-10-CM | POA: Diagnosis not present

## 2021-07-05 ENCOUNTER — Other Ambulatory Visit: Payer: Self-pay | Admitting: Allergy

## 2021-07-17 DIAGNOSIS — G4733 Obstructive sleep apnea (adult) (pediatric): Secondary | ICD-10-CM | POA: Diagnosis not present

## 2021-07-19 DIAGNOSIS — Z23 Encounter for immunization: Secondary | ICD-10-CM | POA: Diagnosis not present

## 2021-07-19 DIAGNOSIS — Z0000 Encounter for general adult medical examination without abnormal findings: Secondary | ICD-10-CM | POA: Diagnosis not present

## 2021-07-19 DIAGNOSIS — E559 Vitamin D deficiency, unspecified: Secondary | ICD-10-CM | POA: Diagnosis not present

## 2021-07-19 DIAGNOSIS — E785 Hyperlipidemia, unspecified: Secondary | ICD-10-CM | POA: Diagnosis not present

## 2021-07-19 DIAGNOSIS — I10 Essential (primary) hypertension: Secondary | ICD-10-CM | POA: Diagnosis not present

## 2021-07-20 ENCOUNTER — Other Ambulatory Visit: Payer: Self-pay | Admitting: Allergy

## 2021-07-23 ENCOUNTER — Encounter: Payer: Self-pay | Admitting: Cardiology

## 2021-07-23 ENCOUNTER — Other Ambulatory Visit: Payer: Self-pay

## 2021-07-23 ENCOUNTER — Ambulatory Visit: Payer: BC Managed Care – PPO | Admitting: Cardiology

## 2021-07-23 VITALS — BP 120/80 | HR 61 | Ht 74.8 in | Wt 252.0 lb

## 2021-07-23 DIAGNOSIS — R079 Chest pain, unspecified: Secondary | ICD-10-CM | POA: Diagnosis not present

## 2021-07-23 DIAGNOSIS — Z8249 Family history of ischemic heart disease and other diseases of the circulatory system: Secondary | ICD-10-CM | POA: Diagnosis not present

## 2021-07-23 DIAGNOSIS — I10 Essential (primary) hypertension: Secondary | ICD-10-CM

## 2021-07-23 MED ORDER — METOPROLOL TARTRATE 50 MG PO TABS: 50.0000 mg | ORAL_TABLET | ORAL | 0 refills | Status: DC

## 2021-07-23 NOTE — Assessment & Plan Note (Addendum)
Given his ongoing periodic chest discomfort, strong family history of coronary disease, valvular abnormality, hyperlipidemia, hypertension, we will go ahead and pursue coronary CT scan with possible FFR analysis to exclude significant coronary artery disease.  If there is nonobstructive plaque present, we will intensify his atorvastatin from 20 up to 40 with LDL goal of less than 70.  Current LDL 74.  For prevention, we will see him back in 1 year.

## 2021-07-23 NOTE — Assessment & Plan Note (Signed)
Currently well controlled on amlodipine as well as increased propranolol.  No changes made.  He is continuing to monitor at home.  Periodic whitecoat hypertension has been noted in the past.  Daily exercise, diet, prevention discussed.

## 2021-07-23 NOTE — Progress Notes (Signed)
Cardiology Office Note:    Date:  07/23/2021   ID:  John Herman, DOB [birth year], MRN [id number]  PCP:  Sigmund HazelMiller, Lisa, MD   [facility] Providers Cardiologist:  Donato SchultzMark Sheridyn Canino, MD     Referring MD: Sigmund HazelMiller, Lisa, MD    History of Present Illness:    John Herman is a 46 y.o. male here for the evaluation of a family history of CAD, heart valve replacement, hyperlipidemia, hypertension.  Previously seen in 2016 with atypical chest discomfort, sharp-like chest pain while playing softball.  Ended up having a stress echocardiogram which was normal.  Likely musculoskeletal discomfort at that time.  He is back in the ER in December 2021 with what was diagnosed as costochondritis.  Has been battling TMJ.  May go under MMA jaw surgery he states.  Back in July he had highly elevated blood pressures of 180/105.  Amlodipine was started, propranolol was increased.  Overall doing well.  Sertraline was also increased at that time.  He still continues to have occasional chest discomfort which worries him.  2021 ER visit again.  Works in [employer], [employer], [employer].   Past Medical History:  Diagnosis Date   Anxiety    Asthma    Chest wall pain    Hyperlipidemia    Hypertension    Laceration of finger    Migraine headache    Sleep apnea     Past Surgical History:  Procedure Laterality Date   ADENOIDECTOMY     APPENDECTOMY  2005   MYRINGOTOMY WITH TUBE PLACEMENT  2008   NASAL SEPTUM SURGERY  1994   TYMPANOSTOMY TUBE PLACEMENT      Current Medications: Current Meds  Medication Sig   ALPRAZolam (XANAX) 0.5 MG tablet Take 0.5 mg by mouth daily as needed.   amLODipine (NORVASC) 5 MG tablet Take 5 mg by mouth daily.   atorvastatin (LIPITOR) 20 MG tablet Take 1 tablet (20 mg total) by mouth daily.   benzonatate (TESSALON) 200 MG capsule Take by mouth.   Cholecalciferol (VITAMIN D) 50 MCG (2000 UT) tablet 1 tablet   Clobetasol Propionate 0.05 % shampoo Apply topically.   fluticasone (FLONASE) 50  MCG/ACT nasal spray Place 1 spray into both nostrils 2 (two) times daily as needed for allergies or rhinitis.   ibuprofen (ADVIL,MOTRIN) 200 MG tablet Take 800 mg by mouth every 6 (six) hours as needed (pain).   ipratropium (ATROVENT) 0.03 % nasal spray PLACE 2 SPRAYS INTO BOTH NOSTRILS 2 (TWO) TIMES DAILY AS NEEDED FOR RHINITIS.   metoprolol tartrate (LOPRESSOR) 50 MG tablet Take 1 tablet (50 mg total) by mouth as directed. Take 1 tablet 2 hours before your CT scan   nitroGLYCERIN (NITROSTAT) 0.4 MG SL tablet Place 1 tablet (0.4 mg total) under the tongue every 5 (five) minutes as needed for chest pain.   Olopatadine HCl 0.2 % SOLN APPLY 1 DROP TO EYE DAILY AS NEEDED (ITCHY/WATERY EYES).   propranolol ER (INDERAL LA) 120 MG 24 hr capsule Take 120 mg by mouth daily.   sertraline (ZOLOFT) 100 MG tablet Take 100 mg by mouth daily.   SUMAtriptan (IMITREX) 100 MG tablet Take 100 mg by mouth daily as needed for migraine. May repeat in 2 hours if headache persists or recurs.   triamcinolone ointment (KENALOG) 0.5 % Apply topically.     Allergies:   Patient has no known allergies.   Social History   Socioeconomic History   Marital status: Divorced    Spouse name: Not on file  Number of children: Not on file   Years of education: Not on file   Highest education level: Not on file  Occupational History   Not on file  Tobacco Use   Smoking status: Former    Types: Cigarettes    Quit date: 02/10/2004    Years since quitting: 17.4   Smokeless tobacco: Former  Building services engineerVaping Use   Vaping Use: Never used  Substance and Sexual Activity   Alcohol use: Yes    Comment: SOCIALLY   Drug use: No   Sexual activity: Yes  Other Topics Concern   Not on file  Social History Narrative   Not on file   Social Determinants of Health   Financial Resource Strain: Not on file  Food Insecurity: Not on file  Transportation Needs: Not on file  Physical Activity: Not on file  Stress: Not on file  Social  Connections: Not on file     Family History: The patient's family history includes Alcoholism in his sister; Hypertension in his father and mother; Other in his sister and sister.  ROS:   Please see the history of present illness.    No fevers chills nausea vomiting syncope bleeding all other systems reviewed and are negative.  EKGs/Labs/Other Studies Reviewed:    The following studies were reviewed today: Stress echo 2016-no ischemia  EKG:  EKG is  ordered today.  The ekg ordered today demonstrates sinus rhythm 61 no other specific abnormalities.  Recent Labs: No results found for requested labs within last 8760 hours.  Recent Lipid Panel No results found for: CHOL, TRIG, HDL, CHOLHDL, VLDL, LDLCALC, LDLDIRECT   Risk Assessment/Calculations:              Physical Exam:    VS:  BP 120/80 (BP Location: Left Arm, Patient Position: Sitting, Cuff Size: Normal)    Pulse 61    Ht 6' 2.8" (1.9 m)    Wt 252 lb (114.3 kg)    SpO2 97%    BMI 31.67 kg/m     Wt Readings from Last 3 Encounters:  07/23/21 252 lb (114.3 kg)  06/09/20 239 lb 12.8 oz (108.8 kg)  06/05/20 230 lb (104.3 kg)     GEN:  Well nourished, well developed in no acute distress HEENT: Normal NECK: No JVD; No carotid bruits LYMPHATICS: No lymphadenopathy CARDIAC: RRR, no murmurs, no rubs, gallops RESPIRATORY:  Clear to auscultation without rales, wheezing or rhonchi  ABDOMEN: Soft, non-tender, non-distended MUSCULOSKELETAL:  No edema; No deformity  SKIN: Warm and dry NEUROLOGIC:  Alert and oriented x 3 PSYCHIATRIC:  Normal affect   ASSESSMENT:    1. Chest pain of uncertain etiology   2. Family history of early CAD   3. Primary hypertension    PLAN:    In order of problems listed above:  Family history of early CAD Grandfather heart attack. Father, valve replacement.  Chest pain of uncertain etiology Given his ongoing periodic chest discomfort, strong family history of coronary disease, valvular  abnormality, hyperlipidemia, hypertension, we will go ahead and pursue coronary CT scan with possible FFR analysis to exclude significant coronary artery disease.  If there is nonobstructive plaque present, we will intensify his atorvastatin from 20 up to 40 with LDL goal of less than 70.  Current LDL 74.  For prevention, we will see him back in 1 year.  HTN (hypertension) Currently well controlled on amlodipine as well as increased propranolol.  No changes made.  He is continuing to monitor at  home.  Periodic whitecoat hypertension has been noted in the past.  Daily exercise, diet, prevention discussed.        Medication Adjustments/Labs and Tests Ordered: Current medicines are reviewed at length with the patient today.  Concerns regarding medicines are outlined above.  Orders Placed This Encounter  Procedures   CT CORONARY MORPH W/CTA COR W/SCORE W/CA W/CM &/OR WO/CM   EKG 12-Lead   Meds ordered this encounter  Medications   metoprolol tartrate (LOPRESSOR) 50 MG tablet    Sig: Take 1 tablet (50 mg total) by mouth as directed. Take 1 tablet 2 hours before your CT scan    Dispense:  1 tablet    Refill:  0    Patient Instructions  Medication Instructions:  The current medical regimen is effective;  continue present plan and medications.  *If you need a refill on your cardiac medications before your next appointment, please call your pharmacy*  Lab Work: none If you have labs (blood work) drawn today and your tests are completely normal, you will receive your results only by: MyChart Message (if you have MyChart) OR A paper copy in the mail If you have any lab test that is abnormal or we need to change your treatment, we will call you to review the results.   Testing/Procedures:   Your cardiac CT will be scheduled at:   [facility] [address] [place], [place] [address] [phone]  Please arrive at the [facility] main entrance (entrance A) of [facility] 30 minutes prior to test start time. You can use the FREE valet parking offered at the main entrance (encouraged to control the heart rate for the test) Proceed to the [facility] Radiology Department (first floor) to check-in and test prep.  Please follow these instructions carefully (unless otherwise directed):  Hold all erectile dysfunction medications at least 3 days (72 hrs) prior to test.  On the Night Before the Test: Be sure to Drink plenty of water. Do not consume any caffeinated/decaffeinated beverages or chocolate 12 hours prior to your test. Do not take any antihistamines 12 hours prior to your test.  On the Day of the Test: Drink plenty of water until 1 hour prior to the test. Do not eat any food 4 hours prior to the test. You may take your regular medications prior to the test.  Take metoprolol (Lopressor) two hours prior to test. HOLD Furosemide/Hydrochlorothiazide morning of the test.  After the Test: Drink plenty of water. After receiving IV contrast, you may experience a mild flushed feeling. This is normal. On occasion, you may experience a mild rash up to 24 hours after the test. This is not dangerous. If this occurs, you can take Benadryl 25 mg and increase your fluid intake. If you experience trouble breathing, this can be serious. If it is severe call 911 IMMEDIATELY. If it is mild, please call our office. If you take any of these medications: Glipizide/Metformin, Avandament, Glucavance, please do not take 48 hours after completing test unless otherwise instructed.  We will call to schedule your test 2-4 weeks out understanding that some insurance companies will need an authorization prior to the service being performed.   For non-scheduling related questions, please contact the cardiac imaging nurse navigator should you have any questions/concerns: Rockwell [place], Cardiac Imaging Nurse Navigator Larey Brick, Cardiac Imaging Nurse Navigator [place]  [facility] Direct Office Dial: [phone]   For scheduling needs, including cancellations and rescheduling,  please call [place], [phone].  Follow-Up: At [facility], you and your health needs are our priority.  As part of our continuing mission to provide you with exceptional heart care, we have created designated Provider Care Teams.  These Care Teams include your primary Cardiologist (physician) and Advanced Practice Providers (APPs -  Physician Assistants and Nurse Practitioners) who all work together to provide you with the care you need, when you need it.  We recommend signing up for the patient portal called "MyChart".  Sign up information is provided on this After Visit Summary.  MyChart is used to connect with patients for Virtual Visits (Telemedicine).  Patients are able to view lab/test results, encounter notes, upcoming appointments, etc.  Non-urgent messages can be sent to your provider as well.   To learn more about what you can do with MyChart, go to [email or web address].    Your next appointment:   1 year(s)  The format for your next appointment:   In Person  Provider:   Dr Donato Schultz   If primary card or EP is not listed click here to update    :1}    Thank you for choosing [facility]!!      Signed, Donato Schultz, MD  07/23/2021 9:01 AM    [facility]

## 2021-07-23 NOTE — Patient Instructions (Signed)
Medication Instructions:  The current medical regimen is effective;  continue present plan and medications.  *If you need a refill on your cardiac medications before your next appointment, please call your pharmacy*  Lab Work: none If you have labs (blood work) drawn today and your tests are completely normal, you will receive your results only by: [place] (if you have MyChart) OR A paper copy in the mail If you have any lab test that is abnormal or we need to change your treatment, we will call you to review the results.   Testing/Procedures:   Your cardiac CT will be scheduled at:   [facility] [address] [place], [place] [address] [phone]  Please arrive at the [facility] main entrance (entrance A) of [facility] 30 minutes prior to test start time. You can use the FREE valet parking offered at the main entrance (encouraged to control the heart rate for the test) Proceed to the [facility] Radiology Department (first floor) to check-in and test prep.  Please follow these instructions carefully (unless otherwise directed):  Hold all erectile dysfunction medications at least 3 days (72 hrs) prior to test.  On the Night Before the Test: Be sure to Drink plenty of water. Do not consume any caffeinated/decaffeinated beverages or chocolate 12 hours prior to your test. Do not take any antihistamines 12 hours prior to your test.  On the Day of the Test: Drink plenty of water until 1 hour prior to the test. Do not eat any food 4 hours prior to the test. You may take your regular medications prior to the test.  Take metoprolol (Lopressor) two hours prior to test. HOLD Furosemide/Hydrochlorothiazide morning of the test.  After the Test: Drink plenty of water. After receiving IV contrast, you may experience a mild flushed feeling. This is normal. On occasion, you may experience a mild rash up to 24 hours after the test. This is not dangerous.  If this occurs, you can take Benadryl 25 mg and increase your fluid intake. If you experience trouble breathing, this can be serious. If it is severe call 911 IMMEDIATELY. If it is mild, please call our office. If you take any of these medications: Glipizide/Metformin, Avandament, Glucavance, please do not take 48 hours after completing test unless otherwise instructed.  We will call to schedule your test 2-4 weeks out understanding that some insurance companies will need an authorization prior to the service being performed.   For non-scheduling related questions, please contact the cardiac imaging nurse navigator should you have any questions/concerns: Marchia Bond, Cardiac Imaging Nurse Navigator Gordy Clement, Cardiac Imaging Nurse Navigator [facility] Direct Office Dial: [phone]   For scheduling needs, including cancellations and rescheduling, please call [place], [phone].  Follow-Up: At [facility], you and your health needs are our priority.  As part of our continuing mission to provide you with exceptional heart care, we have created designated Provider Care Teams.  These Care Teams include your primary Cardiologist (physician) and Advanced Practice Providers (APPs -  Physician Assistants and Nurse Practitioners) who all work together to provide you with the care you need, when you need it.  We recommend signing up for the patient portal called "MyChart".  Sign up information is provided on this After Visit Summary.  MyChart is used to connect with patients for Virtual Visits (Telemedicine).  Patients are able to view lab/test results, encounter notes, upcoming appointments, etc.  Non-urgent messages can be sent  to your provider as well.   To learn more about what you can do with MyChart, go to [email or web address].    Your next appointment:   1 year(s)  The format for your next appointment:   In Person  Provider:   Dr Candee Furbish    If primary card or EP is not listed click here to update    :1}    Thank you for choosing [facility]!!

## 2021-07-23 NOTE — Assessment & Plan Note (Signed)
Grandfather heart attack. Father, valve replacement.

## 2021-08-03 ENCOUNTER — Telehealth (HOSPITAL_COMMUNITY): Payer: Self-pay | Admitting: *Deleted

## 2021-08-03 NOTE — Telephone Encounter (Signed)
Reaching out to patient to offer assistance regarding upcoming cardiac imaging study; pt verbalizes understanding of appt date/time, parking situation and where to check in, pre-test NPO status and medications ordered, and verified current allergies; name and call back number provided for further questions should they arise  Larey Brick RN Navigator Cardiac Imaging [facility] [phone] office [phone] cell  Patient to take 50mg  metoprolol tartrate two hours prior if his HR is greater than 65bpm. He is aware to arrive at 4pm for his 4:30pm scan.

## 2021-08-04 ENCOUNTER — Ambulatory Visit (HOSPITAL_COMMUNITY): Admission: RE | Admit: 2021-08-04 | Payer: BC Managed Care – PPO | Source: Ambulatory Visit

## 2021-08-06 DIAGNOSIS — I861 Scrotal varices: Secondary | ICD-10-CM | POA: Diagnosis not present

## 2021-08-06 DIAGNOSIS — N50812 Left testicular pain: Secondary | ICD-10-CM | POA: Diagnosis not present

## 2021-08-17 DIAGNOSIS — G4733 Obstructive sleep apnea (adult) (pediatric): Secondary | ICD-10-CM | POA: Diagnosis not present

## 2021-09-14 DIAGNOSIS — G4733 Obstructive sleep apnea (adult) (pediatric): Secondary | ICD-10-CM | POA: Diagnosis not present

## 2021-09-16 DIAGNOSIS — M9905 Segmental and somatic dysfunction of pelvic region: Secondary | ICD-10-CM | POA: Diagnosis not present

## 2021-09-16 DIAGNOSIS — M62830 Muscle spasm of back: Secondary | ICD-10-CM | POA: Diagnosis not present

## 2021-09-16 DIAGNOSIS — M5137 Other intervertebral disc degeneration, lumbosacral region: Secondary | ICD-10-CM | POA: Diagnosis not present

## 2021-09-16 DIAGNOSIS — M9903 Segmental and somatic dysfunction of lumbar region: Secondary | ICD-10-CM | POA: Diagnosis not present

## 2021-09-22 DIAGNOSIS — M9905 Segmental and somatic dysfunction of pelvic region: Secondary | ICD-10-CM | POA: Diagnosis not present

## 2021-09-22 DIAGNOSIS — M5137 Other intervertebral disc degeneration, lumbosacral region: Secondary | ICD-10-CM | POA: Diagnosis not present

## 2021-09-22 DIAGNOSIS — M9903 Segmental and somatic dysfunction of lumbar region: Secondary | ICD-10-CM | POA: Diagnosis not present

## 2021-09-22 DIAGNOSIS — M62830 Muscle spasm of back: Secondary | ICD-10-CM | POA: Diagnosis not present

## 2021-09-27 DIAGNOSIS — M62830 Muscle spasm of back: Secondary | ICD-10-CM | POA: Diagnosis not present

## 2021-09-27 DIAGNOSIS — M9905 Segmental and somatic dysfunction of pelvic region: Secondary | ICD-10-CM | POA: Diagnosis not present

## 2021-09-27 DIAGNOSIS — M9903 Segmental and somatic dysfunction of lumbar region: Secondary | ICD-10-CM | POA: Diagnosis not present

## 2021-09-27 DIAGNOSIS — M5137 Other intervertebral disc degeneration, lumbosacral region: Secondary | ICD-10-CM | POA: Diagnosis not present

## 2021-09-29 DIAGNOSIS — M62830 Muscle spasm of back: Secondary | ICD-10-CM | POA: Diagnosis not present

## 2021-09-29 DIAGNOSIS — M9903 Segmental and somatic dysfunction of lumbar region: Secondary | ICD-10-CM | POA: Diagnosis not present

## 2021-09-29 DIAGNOSIS — M9905 Segmental and somatic dysfunction of pelvic region: Secondary | ICD-10-CM | POA: Diagnosis not present

## 2021-09-29 DIAGNOSIS — G4733 Obstructive sleep apnea (adult) (pediatric): Secondary | ICD-10-CM | POA: Diagnosis not present

## 2021-09-29 DIAGNOSIS — M5137 Other intervertebral disc degeneration, lumbosacral region: Secondary | ICD-10-CM | POA: Diagnosis not present

## 2021-10-01 DIAGNOSIS — M5137 Other intervertebral disc degeneration, lumbosacral region: Secondary | ICD-10-CM | POA: Diagnosis not present

## 2021-10-01 DIAGNOSIS — M9903 Segmental and somatic dysfunction of lumbar region: Secondary | ICD-10-CM | POA: Diagnosis not present

## 2021-10-01 DIAGNOSIS — M9905 Segmental and somatic dysfunction of pelvic region: Secondary | ICD-10-CM | POA: Diagnosis not present

## 2021-10-01 DIAGNOSIS — M62830 Muscle spasm of back: Secondary | ICD-10-CM | POA: Diagnosis not present

## 2021-10-01 IMAGING — CR DG CHEST 2V
2 series · 2 of 2 positions shown · non-contrast
Comparison: Radiograph 12/23/2014

CLINICAL DATA: Chest pain. Cough and chest pain for 3 days. COVID 2
weeks ago.

EXAM:
CHEST - 2 VIEW

[chest pa]
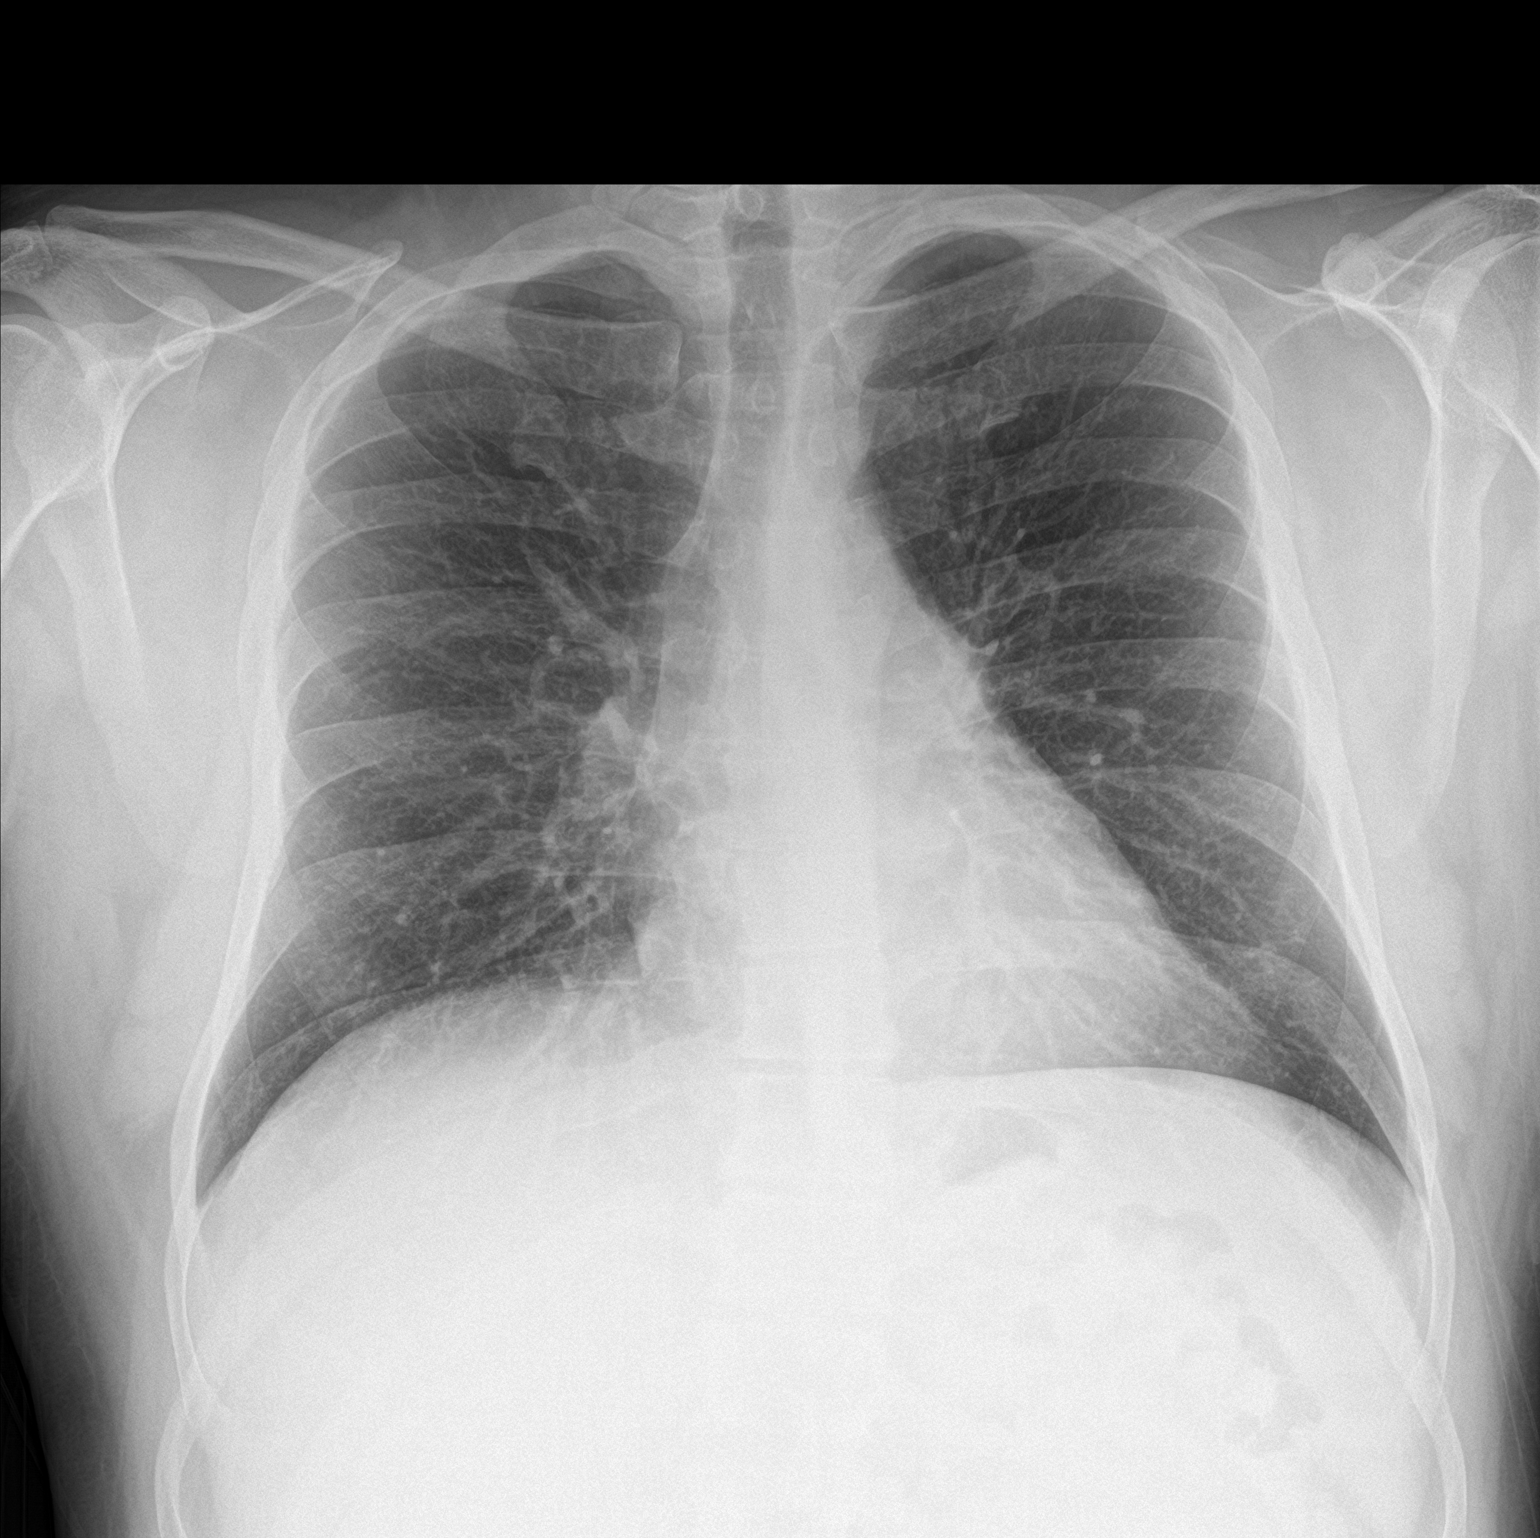

[chest lat]
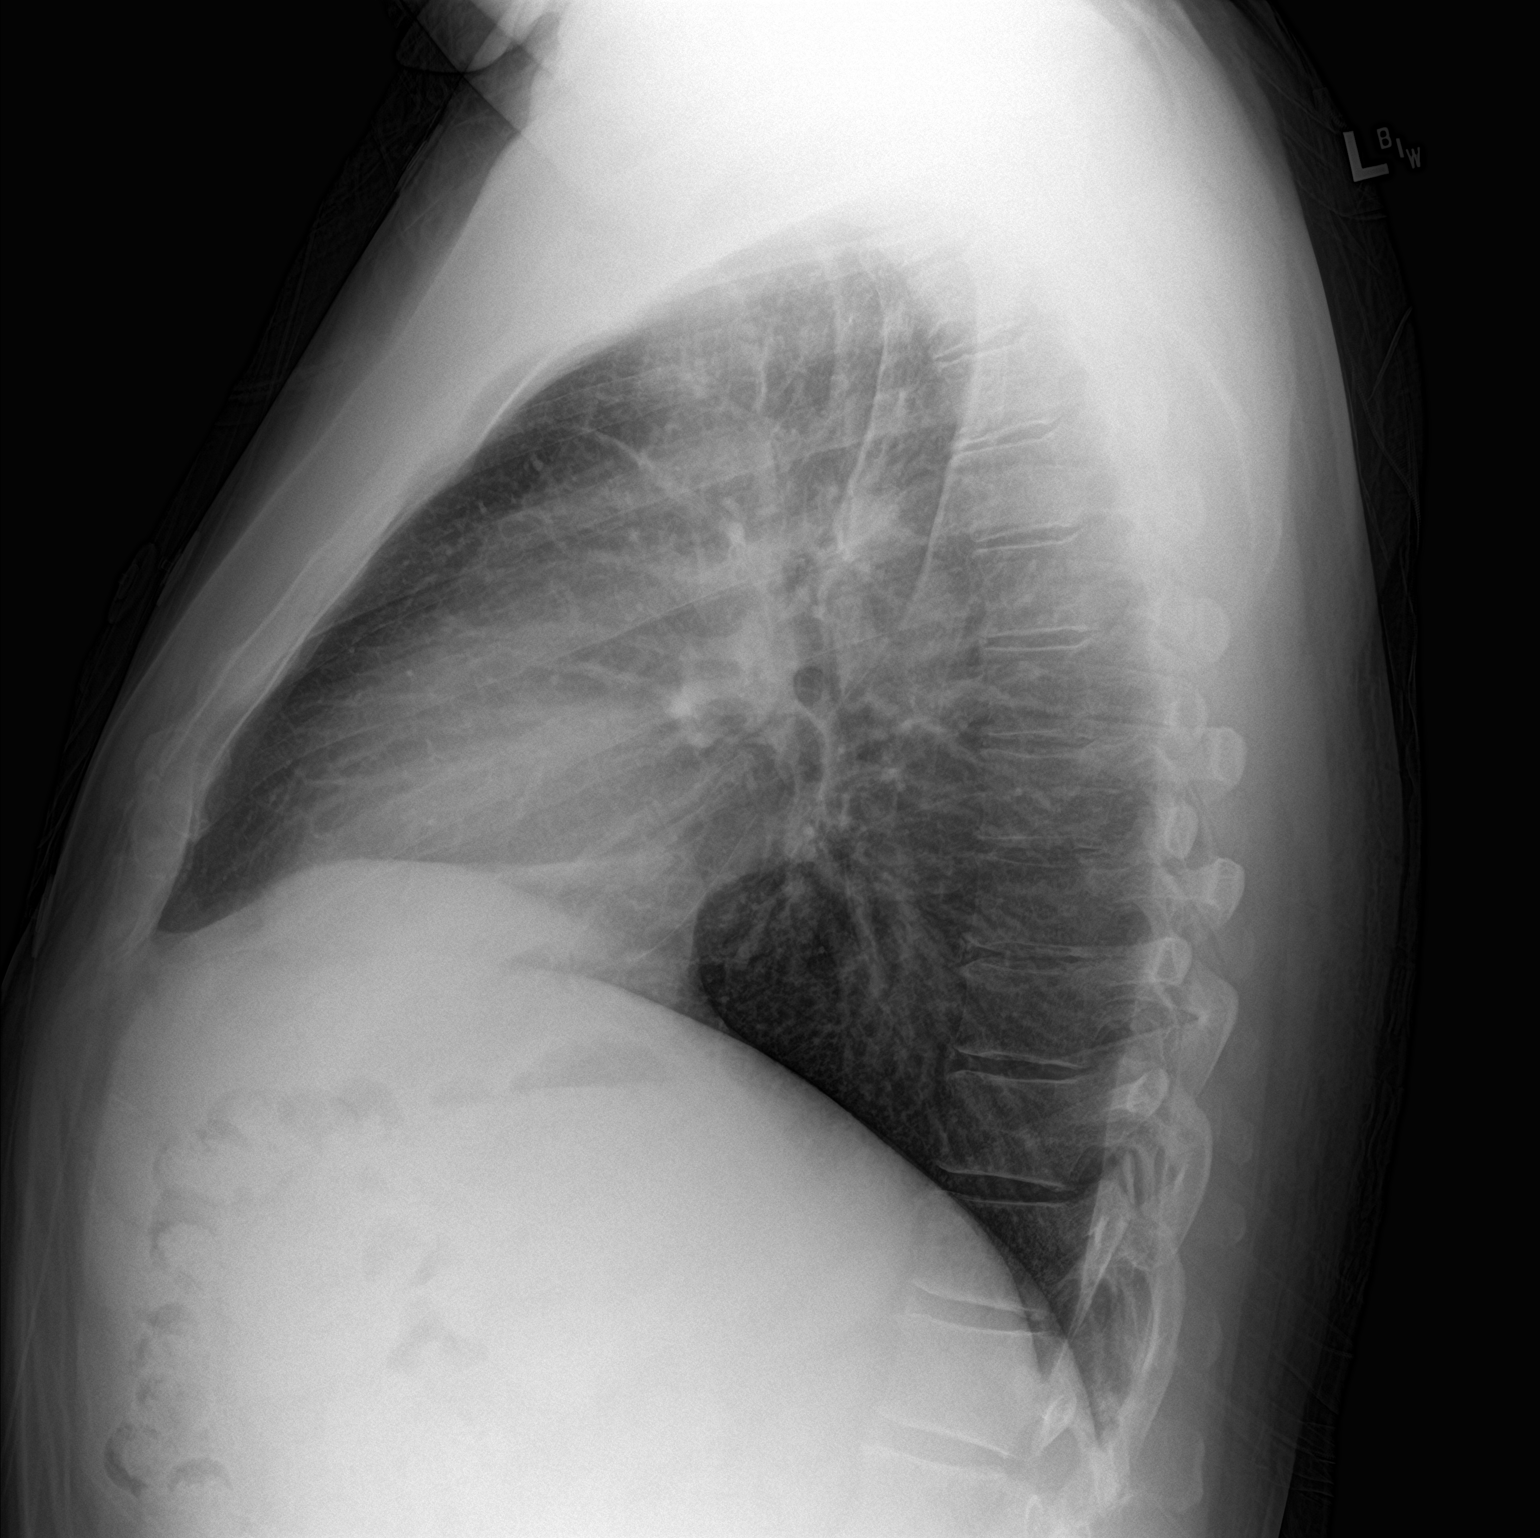

[2 of 2 positions shown; findings below may reference images not displayed]

FINDINGS: The cardiomediastinal contours are normal. No focal airspace disease
seen by radiograph. Pulmonary vasculature is normal. No
consolidation, pleural effusion, or pneumothorax. No acute osseous
abnormalities are seen.
IMPRESSION: Negative radiographs of the chest.

## 2021-10-04 DIAGNOSIS — M62830 Muscle spasm of back: Secondary | ICD-10-CM | POA: Diagnosis not present

## 2021-10-04 DIAGNOSIS — M9905 Segmental and somatic dysfunction of pelvic region: Secondary | ICD-10-CM | POA: Diagnosis not present

## 2021-10-04 DIAGNOSIS — M9903 Segmental and somatic dysfunction of lumbar region: Secondary | ICD-10-CM | POA: Diagnosis not present

## 2021-10-04 DIAGNOSIS — M5137 Other intervertebral disc degeneration, lumbosacral region: Secondary | ICD-10-CM | POA: Diagnosis not present

## 2021-10-06 DIAGNOSIS — M62830 Muscle spasm of back: Secondary | ICD-10-CM | POA: Diagnosis not present

## 2021-10-06 DIAGNOSIS — M5137 Other intervertebral disc degeneration, lumbosacral region: Secondary | ICD-10-CM | POA: Diagnosis not present

## 2021-10-06 DIAGNOSIS — M9905 Segmental and somatic dysfunction of pelvic region: Secondary | ICD-10-CM | POA: Diagnosis not present

## 2021-10-06 DIAGNOSIS — M9903 Segmental and somatic dysfunction of lumbar region: Secondary | ICD-10-CM | POA: Diagnosis not present

## 2021-10-11 DIAGNOSIS — M62830 Muscle spasm of back: Secondary | ICD-10-CM | POA: Diagnosis not present

## 2021-10-11 DIAGNOSIS — M5137 Other intervertebral disc degeneration, lumbosacral region: Secondary | ICD-10-CM | POA: Diagnosis not present

## 2021-10-11 DIAGNOSIS — M9903 Segmental and somatic dysfunction of lumbar region: Secondary | ICD-10-CM | POA: Diagnosis not present

## 2021-10-11 DIAGNOSIS — M9905 Segmental and somatic dysfunction of pelvic region: Secondary | ICD-10-CM | POA: Diagnosis not present

## 2021-10-13 DIAGNOSIS — M9903 Segmental and somatic dysfunction of lumbar region: Secondary | ICD-10-CM | POA: Diagnosis not present

## 2021-10-13 DIAGNOSIS — M5137 Other intervertebral disc degeneration, lumbosacral region: Secondary | ICD-10-CM | POA: Diagnosis not present

## 2021-10-13 DIAGNOSIS — M62830 Muscle spasm of back: Secondary | ICD-10-CM | POA: Diagnosis not present

## 2021-10-13 DIAGNOSIS — M9905 Segmental and somatic dysfunction of pelvic region: Secondary | ICD-10-CM | POA: Diagnosis not present

## 2021-10-15 DIAGNOSIS — G4733 Obstructive sleep apnea (adult) (pediatric): Secondary | ICD-10-CM | POA: Diagnosis not present

## 2021-10-15 DIAGNOSIS — M62830 Muscle spasm of back: Secondary | ICD-10-CM | POA: Diagnosis not present

## 2021-10-15 DIAGNOSIS — M9903 Segmental and somatic dysfunction of lumbar region: Secondary | ICD-10-CM | POA: Diagnosis not present

## 2021-10-15 DIAGNOSIS — M9905 Segmental and somatic dysfunction of pelvic region: Secondary | ICD-10-CM | POA: Diagnosis not present

## 2021-10-15 DIAGNOSIS — M5137 Other intervertebral disc degeneration, lumbosacral region: Secondary | ICD-10-CM | POA: Diagnosis not present

## 2021-10-18 DIAGNOSIS — M9903 Segmental and somatic dysfunction of lumbar region: Secondary | ICD-10-CM | POA: Diagnosis not present

## 2021-10-18 DIAGNOSIS — M5137 Other intervertebral disc degeneration, lumbosacral region: Secondary | ICD-10-CM | POA: Diagnosis not present

## 2021-10-18 DIAGNOSIS — M62830 Muscle spasm of back: Secondary | ICD-10-CM | POA: Diagnosis not present

## 2021-10-18 DIAGNOSIS — M9905 Segmental and somatic dysfunction of pelvic region: Secondary | ICD-10-CM | POA: Diagnosis not present

## 2021-10-25 DIAGNOSIS — M9903 Segmental and somatic dysfunction of lumbar region: Secondary | ICD-10-CM | POA: Diagnosis not present

## 2021-10-25 DIAGNOSIS — M62830 Muscle spasm of back: Secondary | ICD-10-CM | POA: Diagnosis not present

## 2021-10-25 DIAGNOSIS — M9905 Segmental and somatic dysfunction of pelvic region: Secondary | ICD-10-CM | POA: Diagnosis not present

## 2021-10-25 DIAGNOSIS — M5137 Other intervertebral disc degeneration, lumbosacral region: Secondary | ICD-10-CM | POA: Diagnosis not present

## 2021-10-27 DIAGNOSIS — M62830 Muscle spasm of back: Secondary | ICD-10-CM | POA: Diagnosis not present

## 2021-10-27 DIAGNOSIS — M9905 Segmental and somatic dysfunction of pelvic region: Secondary | ICD-10-CM | POA: Diagnosis not present

## 2021-10-27 DIAGNOSIS — M9903 Segmental and somatic dysfunction of lumbar region: Secondary | ICD-10-CM | POA: Diagnosis not present

## 2021-10-27 DIAGNOSIS — M5137 Other intervertebral disc degeneration, lumbosacral region: Secondary | ICD-10-CM | POA: Diagnosis not present

## 2021-11-01 DIAGNOSIS — M9905 Segmental and somatic dysfunction of pelvic region: Secondary | ICD-10-CM | POA: Diagnosis not present

## 2021-11-01 DIAGNOSIS — M5137 Other intervertebral disc degeneration, lumbosacral region: Secondary | ICD-10-CM | POA: Diagnosis not present

## 2021-11-01 DIAGNOSIS — M62830 Muscle spasm of back: Secondary | ICD-10-CM | POA: Diagnosis not present

## 2021-11-01 DIAGNOSIS — M9903 Segmental and somatic dysfunction of lumbar region: Secondary | ICD-10-CM | POA: Diagnosis not present

## 2021-11-14 DIAGNOSIS — G4733 Obstructive sleep apnea (adult) (pediatric): Secondary | ICD-10-CM | POA: Diagnosis not present

## 2021-12-17 ENCOUNTER — Encounter (HOSPITAL_COMMUNITY): Payer: Self-pay

## 2022-01-05 DIAGNOSIS — G4733 Obstructive sleep apnea (adult) (pediatric): Secondary | ICD-10-CM | POA: Diagnosis not present

## 2022-02-02 DIAGNOSIS — L218 Other seborrheic dermatitis: Secondary | ICD-10-CM | POA: Diagnosis not present

## 2022-03-31 DIAGNOSIS — M9901 Segmental and somatic dysfunction of cervical region: Secondary | ICD-10-CM | POA: Diagnosis not present

## 2022-03-31 DIAGNOSIS — M9903 Segmental and somatic dysfunction of lumbar region: Secondary | ICD-10-CM | POA: Diagnosis not present

## 2022-03-31 DIAGNOSIS — M9904 Segmental and somatic dysfunction of sacral region: Secondary | ICD-10-CM | POA: Diagnosis not present

## 2022-03-31 DIAGNOSIS — M545 Low back pain, unspecified: Secondary | ICD-10-CM | POA: Diagnosis not present

## 2022-04-01 DIAGNOSIS — M9901 Segmental and somatic dysfunction of cervical region: Secondary | ICD-10-CM | POA: Diagnosis not present

## 2022-04-01 DIAGNOSIS — M545 Low back pain, unspecified: Secondary | ICD-10-CM | POA: Diagnosis not present

## 2022-04-01 DIAGNOSIS — M9904 Segmental and somatic dysfunction of sacral region: Secondary | ICD-10-CM | POA: Diagnosis not present

## 2022-04-01 DIAGNOSIS — M9903 Segmental and somatic dysfunction of lumbar region: Secondary | ICD-10-CM | POA: Diagnosis not present

## 2022-04-05 DIAGNOSIS — M9903 Segmental and somatic dysfunction of lumbar region: Secondary | ICD-10-CM | POA: Diagnosis not present

## 2022-04-05 DIAGNOSIS — M9901 Segmental and somatic dysfunction of cervical region: Secondary | ICD-10-CM | POA: Diagnosis not present

## 2022-04-05 DIAGNOSIS — M545 Low back pain, unspecified: Secondary | ICD-10-CM | POA: Diagnosis not present

## 2022-04-05 DIAGNOSIS — M9904 Segmental and somatic dysfunction of sacral region: Secondary | ICD-10-CM | POA: Diagnosis not present

## 2022-04-07 DIAGNOSIS — G4733 Obstructive sleep apnea (adult) (pediatric): Secondary | ICD-10-CM | POA: Diagnosis not present

## 2022-04-19 DIAGNOSIS — M545 Low back pain, unspecified: Secondary | ICD-10-CM | POA: Diagnosis not present

## 2022-04-19 DIAGNOSIS — M9904 Segmental and somatic dysfunction of sacral region: Secondary | ICD-10-CM | POA: Diagnosis not present

## 2022-04-19 DIAGNOSIS — M9901 Segmental and somatic dysfunction of cervical region: Secondary | ICD-10-CM | POA: Diagnosis not present

## 2022-04-19 DIAGNOSIS — M9903 Segmental and somatic dysfunction of lumbar region: Secondary | ICD-10-CM | POA: Diagnosis not present

## 2022-04-22 DIAGNOSIS — M545 Low back pain, unspecified: Secondary | ICD-10-CM | POA: Diagnosis not present

## 2022-04-22 DIAGNOSIS — M9901 Segmental and somatic dysfunction of cervical region: Secondary | ICD-10-CM | POA: Diagnosis not present

## 2022-04-22 DIAGNOSIS — M9904 Segmental and somatic dysfunction of sacral region: Secondary | ICD-10-CM | POA: Diagnosis not present

## 2022-04-22 DIAGNOSIS — M9903 Segmental and somatic dysfunction of lumbar region: Secondary | ICD-10-CM | POA: Diagnosis not present

## 2022-04-28 DIAGNOSIS — M9901 Segmental and somatic dysfunction of cervical region: Secondary | ICD-10-CM | POA: Diagnosis not present

## 2022-04-28 DIAGNOSIS — M9903 Segmental and somatic dysfunction of lumbar region: Secondary | ICD-10-CM | POA: Diagnosis not present

## 2022-04-28 DIAGNOSIS — M9904 Segmental and somatic dysfunction of sacral region: Secondary | ICD-10-CM | POA: Diagnosis not present

## 2022-04-28 DIAGNOSIS — M545 Low back pain, unspecified: Secondary | ICD-10-CM | POA: Diagnosis not present

## 2022-05-05 DIAGNOSIS — M9904 Segmental and somatic dysfunction of sacral region: Secondary | ICD-10-CM | POA: Diagnosis not present

## 2022-05-05 DIAGNOSIS — M9903 Segmental and somatic dysfunction of lumbar region: Secondary | ICD-10-CM | POA: Diagnosis not present

## 2022-05-05 DIAGNOSIS — M9901 Segmental and somatic dysfunction of cervical region: Secondary | ICD-10-CM | POA: Diagnosis not present

## 2022-05-05 DIAGNOSIS — M545 Low back pain, unspecified: Secondary | ICD-10-CM | POA: Diagnosis not present

## 2022-05-26 DIAGNOSIS — M545 Low back pain, unspecified: Secondary | ICD-10-CM | POA: Diagnosis not present

## 2022-05-26 DIAGNOSIS — M9904 Segmental and somatic dysfunction of sacral region: Secondary | ICD-10-CM | POA: Diagnosis not present

## 2022-05-26 DIAGNOSIS — M9903 Segmental and somatic dysfunction of lumbar region: Secondary | ICD-10-CM | POA: Diagnosis not present

## 2022-05-26 DIAGNOSIS — M9901 Segmental and somatic dysfunction of cervical region: Secondary | ICD-10-CM | POA: Diagnosis not present

## 2022-05-31 DIAGNOSIS — M9901 Segmental and somatic dysfunction of cervical region: Secondary | ICD-10-CM | POA: Diagnosis not present

## 2022-05-31 DIAGNOSIS — M9904 Segmental and somatic dysfunction of sacral region: Secondary | ICD-10-CM | POA: Diagnosis not present

## 2022-05-31 DIAGNOSIS — M545 Low back pain, unspecified: Secondary | ICD-10-CM | POA: Diagnosis not present

## 2022-05-31 DIAGNOSIS — M9903 Segmental and somatic dysfunction of lumbar region: Secondary | ICD-10-CM | POA: Diagnosis not present

## 2022-06-02 DIAGNOSIS — M9903 Segmental and somatic dysfunction of lumbar region: Secondary | ICD-10-CM | POA: Diagnosis not present

## 2022-06-02 DIAGNOSIS — M9901 Segmental and somatic dysfunction of cervical region: Secondary | ICD-10-CM | POA: Diagnosis not present

## 2022-06-02 DIAGNOSIS — M545 Low back pain, unspecified: Secondary | ICD-10-CM | POA: Diagnosis not present

## 2022-06-02 DIAGNOSIS — M9904 Segmental and somatic dysfunction of sacral region: Secondary | ICD-10-CM | POA: Diagnosis not present

## 2022-07-11 DIAGNOSIS — I10 Essential (primary) hypertension: Secondary | ICD-10-CM | POA: Diagnosis not present

## 2022-07-11 DIAGNOSIS — Z6832 Body mass index (BMI) 32.0-32.9, adult: Secondary | ICD-10-CM | POA: Diagnosis not present

## 2022-07-11 DIAGNOSIS — F419 Anxiety disorder, unspecified: Secondary | ICD-10-CM | POA: Diagnosis not present

## 2022-07-11 DIAGNOSIS — R6882 Decreased libido: Secondary | ICD-10-CM | POA: Diagnosis not present

## 2022-07-11 DIAGNOSIS — E785 Hyperlipidemia, unspecified: Secondary | ICD-10-CM | POA: Diagnosis not present

## 2022-07-11 DIAGNOSIS — Z7689 Persons encountering health services in other specified circumstances: Secondary | ICD-10-CM | POA: Diagnosis not present

## 2022-07-20 DIAGNOSIS — E291 Testicular hypofunction: Secondary | ICD-10-CM | POA: Insufficient documentation

## 2022-08-02 DIAGNOSIS — M4316 Spondylolisthesis, lumbar region: Secondary | ICD-10-CM | POA: Diagnosis not present

## 2022-08-02 DIAGNOSIS — M5432 Sciatica, left side: Secondary | ICD-10-CM | POA: Diagnosis not present

## 2022-08-02 DIAGNOSIS — M5431 Sciatica, right side: Secondary | ICD-10-CM | POA: Diagnosis not present

## 2022-08-02 DIAGNOSIS — G4733 Obstructive sleep apnea (adult) (pediatric): Secondary | ICD-10-CM | POA: Diagnosis not present

## 2022-08-08 DIAGNOSIS — L218 Other seborrheic dermatitis: Secondary | ICD-10-CM | POA: Diagnosis not present

## 2022-08-15 DIAGNOSIS — F419 Anxiety disorder, unspecified: Secondary | ICD-10-CM | POA: Diagnosis not present

## 2022-08-15 DIAGNOSIS — G43909 Migraine, unspecified, not intractable, without status migrainosus: Secondary | ICD-10-CM | POA: Diagnosis not present

## 2022-08-15 DIAGNOSIS — E669 Obesity, unspecified: Secondary | ICD-10-CM | POA: Diagnosis not present

## 2022-08-15 DIAGNOSIS — I10 Essential (primary) hypertension: Secondary | ICD-10-CM | POA: Diagnosis not present

## 2022-08-24 DIAGNOSIS — M79605 Pain in left leg: Secondary | ICD-10-CM | POA: Diagnosis not present

## 2022-08-24 DIAGNOSIS — M4316 Spondylolisthesis, lumbar region: Secondary | ICD-10-CM | POA: Diagnosis not present

## 2022-08-24 DIAGNOSIS — M2569 Stiffness of other specified joint, not elsewhere classified: Secondary | ICD-10-CM | POA: Diagnosis not present

## 2022-08-24 DIAGNOSIS — M6281 Muscle weakness (generalized): Secondary | ICD-10-CM | POA: Diagnosis not present

## 2022-08-25 DIAGNOSIS — M2569 Stiffness of other specified joint, not elsewhere classified: Secondary | ICD-10-CM | POA: Diagnosis not present

## 2022-08-25 DIAGNOSIS — M4316 Spondylolisthesis, lumbar region: Secondary | ICD-10-CM | POA: Diagnosis not present

## 2022-08-25 DIAGNOSIS — M79605 Pain in left leg: Secondary | ICD-10-CM | POA: Diagnosis not present

## 2022-08-25 DIAGNOSIS — M6281 Muscle weakness (generalized): Secondary | ICD-10-CM | POA: Diagnosis not present

## 2022-09-08 DIAGNOSIS — M5431 Sciatica, right side: Secondary | ICD-10-CM | POA: Diagnosis not present

## 2022-09-08 DIAGNOSIS — M5432 Sciatica, left side: Secondary | ICD-10-CM | POA: Diagnosis not present

## 2022-09-08 DIAGNOSIS — M4316 Spondylolisthesis, lumbar region: Secondary | ICD-10-CM | POA: Diagnosis not present

## 2022-09-09 ENCOUNTER — Other Ambulatory Visit: Payer: Self-pay | Admitting: Orthopedic Surgery

## 2022-09-09 DIAGNOSIS — M5431 Sciatica, right side: Secondary | ICD-10-CM

## 2022-09-09 DIAGNOSIS — M4316 Spondylolisthesis, lumbar region: Secondary | ICD-10-CM

## 2022-09-12 DIAGNOSIS — M2569 Stiffness of other specified joint, not elsewhere classified: Secondary | ICD-10-CM | POA: Diagnosis not present

## 2022-09-12 DIAGNOSIS — M4316 Spondylolisthesis, lumbar region: Secondary | ICD-10-CM | POA: Diagnosis not present

## 2022-09-12 DIAGNOSIS — M6281 Muscle weakness (generalized): Secondary | ICD-10-CM | POA: Diagnosis not present

## 2022-09-12 DIAGNOSIS — M79605 Pain in left leg: Secondary | ICD-10-CM | POA: Diagnosis not present

## 2022-09-14 DIAGNOSIS — F419 Anxiety disorder, unspecified: Secondary | ICD-10-CM | POA: Diagnosis not present

## 2022-09-14 DIAGNOSIS — Z6832 Body mass index (BMI) 32.0-32.9, adult: Secondary | ICD-10-CM | POA: Diagnosis not present

## 2022-09-14 DIAGNOSIS — M79605 Pain in left leg: Secondary | ICD-10-CM | POA: Diagnosis not present

## 2022-09-14 DIAGNOSIS — M6281 Muscle weakness (generalized): Secondary | ICD-10-CM | POA: Diagnosis not present

## 2022-09-14 DIAGNOSIS — M2569 Stiffness of other specified joint, not elsewhere classified: Secondary | ICD-10-CM | POA: Diagnosis not present

## 2022-09-14 DIAGNOSIS — I10 Essential (primary) hypertension: Secondary | ICD-10-CM | POA: Diagnosis not present

## 2022-09-14 DIAGNOSIS — M4316 Spondylolisthesis, lumbar region: Secondary | ICD-10-CM | POA: Diagnosis not present

## 2022-09-26 ENCOUNTER — Encounter: Payer: Self-pay | Admitting: Orthopedic Surgery

## 2022-10-02 ENCOUNTER — Ambulatory Visit
Admission: RE | Admit: 2022-10-02 | Discharge: 2022-10-02 | Disposition: A | Discharge status: Home / Self Care | Payer: BC Managed Care – PPO | Source: Ambulatory Visit | Attending: Orthopedic Surgery | Admitting: Orthopedic Surgery

## 2022-10-02 DIAGNOSIS — M5136 Other intervertebral disc degeneration, lumbar region: Secondary | ICD-10-CM | POA: Diagnosis not present

## 2022-10-02 DIAGNOSIS — M4316 Spondylolisthesis, lumbar region: Secondary | ICD-10-CM

## 2022-10-02 DIAGNOSIS — M5431 Sciatica, right side: Secondary | ICD-10-CM

## 2022-10-02 DIAGNOSIS — M5127 Other intervertebral disc displacement, lumbosacral region: Secondary | ICD-10-CM | POA: Diagnosis not present

## 2022-10-26 DIAGNOSIS — G4733 Obstructive sleep apnea (adult) (pediatric): Secondary | ICD-10-CM | POA: Diagnosis not present

## 2022-10-26 DIAGNOSIS — E349 Endocrine disorder, unspecified: Secondary | ICD-10-CM | POA: Diagnosis not present

## 2022-10-26 DIAGNOSIS — I10 Essential (primary) hypertension: Secondary | ICD-10-CM | POA: Diagnosis not present

## 2022-10-26 DIAGNOSIS — E785 Hyperlipidemia, unspecified: Secondary | ICD-10-CM | POA: Diagnosis not present

## 2022-10-28 DIAGNOSIS — M5416 Radiculopathy, lumbar region: Secondary | ICD-10-CM | POA: Diagnosis not present

## 2022-10-28 DIAGNOSIS — M4316 Spondylolisthesis, lumbar region: Secondary | ICD-10-CM | POA: Diagnosis not present

## 2022-11-02 DIAGNOSIS — G4733 Obstructive sleep apnea (adult) (pediatric): Secondary | ICD-10-CM | POA: Diagnosis not present

## 2022-11-24 DIAGNOSIS — R634 Abnormal weight loss: Secondary | ICD-10-CM | POA: Diagnosis not present

## 2022-11-24 DIAGNOSIS — G4733 Obstructive sleep apnea (adult) (pediatric): Secondary | ICD-10-CM | POA: Diagnosis not present

## 2022-11-24 DIAGNOSIS — I10 Essential (primary) hypertension: Secondary | ICD-10-CM | POA: Diagnosis not present

## 2022-12-07 DIAGNOSIS — M5416 Radiculopathy, lumbar region: Secondary | ICD-10-CM | POA: Diagnosis not present

## 2022-12-14 DIAGNOSIS — R634 Abnormal weight loss: Secondary | ICD-10-CM | POA: Diagnosis not present

## 2022-12-14 DIAGNOSIS — I10 Essential (primary) hypertension: Secondary | ICD-10-CM | POA: Diagnosis not present

## 2022-12-14 DIAGNOSIS — G4733 Obstructive sleep apnea (adult) (pediatric): Secondary | ICD-10-CM | POA: Diagnosis not present

## 2022-12-14 DIAGNOSIS — R0683 Snoring: Secondary | ICD-10-CM | POA: Diagnosis not present

## 2022-12-21 DIAGNOSIS — M4316 Spondylolisthesis, lumbar region: Secondary | ICD-10-CM | POA: Diagnosis not present

## 2022-12-21 DIAGNOSIS — M5416 Radiculopathy, lumbar region: Secondary | ICD-10-CM | POA: Diagnosis not present

## 2023-03-09 DIAGNOSIS — G4733 Obstructive sleep apnea (adult) (pediatric): Secondary | ICD-10-CM | POA: Diagnosis not present

## 2023-03-30 DIAGNOSIS — M4316 Spondylolisthesis, lumbar region: Secondary | ICD-10-CM | POA: Diagnosis not present

## 2023-03-30 DIAGNOSIS — M5416 Radiculopathy, lumbar region: Secondary | ICD-10-CM | POA: Diagnosis not present

## 2023-04-19 ENCOUNTER — Ambulatory Visit (HOSPITAL_BASED_OUTPATIENT_CLINIC_OR_DEPARTMENT_OTHER): Payer: BC Managed Care – PPO | Admitting: Family Medicine

## 2023-04-25 DIAGNOSIS — L739 Follicular disorder, unspecified: Secondary | ICD-10-CM | POA: Diagnosis not present

## 2023-05-29 LAB — LAB REPORT - SCANNED
Calcium: 9.8
Creatinine, POC: 2.68 mg/dL
EGFR: 97

## 2023-06-08 DIAGNOSIS — G4733 Obstructive sleep apnea (adult) (pediatric): Secondary | ICD-10-CM | POA: Diagnosis not present

## 2023-07-18 ENCOUNTER — Ambulatory Visit (INDEPENDENT_AMBULATORY_CARE_PROVIDER_SITE_OTHER): Payer: BC Managed Care – PPO | Admitting: Family Medicine

## 2023-07-18 DIAGNOSIS — F419 Anxiety disorder, unspecified: Secondary | ICD-10-CM

## 2023-07-18 DIAGNOSIS — E785 Hyperlipidemia, unspecified: Secondary | ICD-10-CM

## 2023-07-18 DIAGNOSIS — I10 Essential (primary) hypertension: Secondary | ICD-10-CM | POA: Diagnosis not present

## 2023-07-18 DIAGNOSIS — Z0000 Encounter for general adult medical examination without abnormal findings: Secondary | ICD-10-CM

## 2023-07-18 MED ORDER — TELMISARTAN 40 MG PO TABS: 40.0000 mg | ORAL_TABLET | Freq: Every day | ORAL | 1 refills | Status: DC

## 2023-07-18 MED ORDER — ALPRAZOLAM 0.5 MG PO TABS: 0.5000 mg | ORAL_TABLET | Freq: Every day | ORAL | 0 refills | Status: DC | PRN

## 2023-07-18 NOTE — Patient Instructions (Signed)
  Medication Instructions:  Your physician recommends that you continue on your current medications as directed. Please refer to the Current Medication list given to you today. --If you need a refill on any your medications before your next appointment, please call your pharmacy first. If no refills are authorized on file call the office.-- Lab Work: Your physician has recommended that you have lab work today: 1 week before next visit  If you have labs (blood work) drawn today and your tests are completely normal, you will receive your results via MyChart message OR a phone call from our staff.  Please ensure you check your voicemail in the event that you authorized detailed messages to be left on a delegated number. If you have any lab test that is abnormal or we need to change your treatment, we will call you to review the results.    Follow-Up: Your next appointment:   Your physician recommends that you schedule a follow-up appointment in: 1 month physical with Dr. de [place]  You will receive a text message or e-mail with a link to a survey about your care and experience with [place] today! We would greatly appreciate your feedback!   Thanks for letting us be apart of your health journey!!  Primary Care and Sports Medicine   Dr. Ceasar Mons [place]   We encourage you to activate your patient portal called "MyChart".  Sign up information is provided on this After Visit Summary.  MyChart is used to connect with patients for Virtual Visits (Telemedicine).  Patients are able to view lab/test results, encounter notes, upcoming appointments, etc.  Non-urgent messages can be sent to your provider as well. To learn more about what you can do with MyChart, please visit --  [email or web address].

## 2023-07-18 NOTE — Progress Notes (Signed)
New Patient Office Visit  Subjective   Patient ID: John Herman, male    DOB: [birth year]  Age: 48 y.o. MRN: [id number]  CC:  Chief Complaint  Patient presents with   Establish Care    Pt. Here to establish care.     HPI John Herman presents to establish care Last PCP - more than 1 year ago, was following with online provider in the interim  Anxiety: currently taking fluoxetine, was prescribed sertraline in the past. Had sexual side effects with higher dose of sertraline in the past.  He also uses alprazolam as needed, this is infrequent in regards to how often it is required.  HTN: Current medications include amlodipine 10 mg and telmisartan 40 mg.  Reports that he has been doing well with medications, no issues reported at this time.  Does need refill of telmisartan today.  Denies any chest pain or headaches.  Prediabetes: reports being diagnosed with this in the past. Has been focusing on lifestyle modifications.  Proteinuria noted on recent labs through [facility].  Patient is originally from [place], grew up in [place]. Has lived here for 13 years. 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 He enjoys sports, wood working, remodeling.  Outpatient Encounter Medications as of 07/18/2023  Medication Sig   amLODipine (NORVASC) 5 MG tablet Take 10 mg by mouth daily.   atorvastatin (LIPITOR) 20 MG tablet Take 1 tablet (20 mg total) by mouth daily.   Cholecalciferol (VITAMIN D) 50 MCG (2000 UT) tablet 1 tablet   [DISCONTINUED] Clobetasol Propionate 0.05 % shampoo Apply topically.   [DISCONTINUED] telmisartan (MICARDIS) 40 MG tablet Take 40 mg by mouth daily.   ALPRAZolam (XANAX) 0.5 MG tablet Take 1 tablet (0.5 mg total) by mouth daily as needed.   Olopatadine HCl 0.2 % SOLN APPLY 1 DROP TO EYE DAILY AS NEEDED (ITCHY/WATERY EYES).   SUMAtriptan (IMITREX) 100 MG tablet Take 100 mg by mouth daily as needed for migraine. May repeat in 2 hours if  headache persists or recurs. (Patient not taking: Reported on 07/18/2023)   telmisartan (MICARDIS) 40 MG tablet Take 1 tablet (40 mg total) by mouth daily.   [DISCONTINUED] ALPRAZolam (XANAX) 0.5 MG tablet Take 0.5 mg by mouth daily as needed. (Patient not taking: Reported on 07/18/2023)   [DISCONTINUED] benzonatate (TESSALON) 200 MG capsule Take by mouth. (Patient not taking: Reported on 07/18/2023)   [DISCONTINUED] fluticasone (FLONASE) 50 MCG/ACT nasal spray Place 1 spray into both nostrils 2 (two) times daily as needed for allergies or rhinitis. (Patient not taking: Reported on 07/18/2023)   [DISCONTINUED] ibuprofen (ADVIL,MOTRIN) 200 MG tablet Take 800 mg by mouth every 6 (six) hours as needed (pain). (Patient not taking: Reported on 07/18/2023)   [DISCONTINUED] ipratropium (ATROVENT) 0.03 % nasal spray PLACE 2 SPRAYS INTO BOTH NOSTRILS 2 (TWO) TIMES DAILY AS NEEDED FOR RHINITIS.   [DISCONTINUED] metoprolol tartrate (LOPRESSOR) 50 MG tablet Take 1 tablet (50 mg total) by mouth as directed. Take 1 tablet 2 hours before your CT scan (Patient not taking: Reported on 07/18/2023)   [DISCONTINUED] nitroGLYCERIN (NITROSTAT) 0.4 MG SL tablet Place 1 tablet (0.4 mg total) under the tongue every 5 (five) minutes as needed for chest pain. (Patient not taking: Reported on 07/18/2023)   [DISCONTINUED] propranolol ER (INDERAL LA) 120 MG 24 hr capsule Take 120 mg by mouth daily. (Patient not taking: Reported on 07/18/2023)   [DISCONTINUED] sertraline (ZOLOFT) 100 MG tablet Take 100 mg by  mouth daily. (Patient not taking: Reported on 07/18/2023)   [DISCONTINUED] triamcinolone ointment (KENALOG) 0.5 % Apply topically. (Patient not taking: Reported on 07/18/2023)   No facility-administered encounter medications on file as of 07/18/2023.    Past Medical History:  Diagnosis Date   Anxiety    Asthma    Chest wall pain    Hyperlipidemia    Hypertension    Laceration of finger    Migraine headache    Sleep apnea      Past Surgical History:  Procedure Laterality Date   ADENOIDECTOMY     APPENDECTOMY  2005   MYRINGOTOMY WITH TUBE PLACEMENT  2008   NASAL SEPTUM SURGERY  1994   TYMPANOSTOMY TUBE PLACEMENT      Family History  Problem Relation Age of Onset   Hypertension Mother    Hypertension Father    Alcoholism Sister    Other Sister        HEALTHY   Other Sister        HEALTHY    Social History   Socioeconomic History   Marital status: Divorced    Spouse name: Not on file   Number of children: Not on file   Years of education: Not on file   Highest education level: Bachelor's degree (e.g., BA, AB, BS)  Occupational History   Not on file  Tobacco Use   Smoking status: Former    Current packs/day: 0.00    Types: Cigarettes    Quit date: 02/10/2004    Years since quitting: 19.4   Smokeless tobacco: Former  Building services engineer status: Never Used  Substance and Sexual Activity   Alcohol use: Yes    Comment: SOCIALLY   Drug use: No   Sexual activity: Yes  Other Topics Concern   Not on file  Social History Narrative   Not on file   Social Drivers of Health   Financial Resource Strain: Low Risk  (07/18/2023)   Overall Financial Resource Strain (CARDIA)    Difficulty of Paying Living Expenses: Not hard at all  Food Insecurity: No Food Insecurity (07/18/2023)   Hunger Vital Sign    Worried About Running Out of Food in the Last Year: Never true    Ran Out of Food in the Last Year: Never true  Transportation Needs: No Transportation Needs (07/18/2023)   PRAPARE - Administrator, Civil Service (Medical): No    Lack of Transportation (Non-Medical): No  Physical Activity: Insufficiently Active (07/18/2023)   Exercise Vital Sign    Days of Exercise per Week: 2 days    Minutes of Exercise per Session: 20 min  Stress: Stress Concern Present (07/18/2023)   [employer] of Occupational Health - Occupational Stress Questionnaire    Feeling of Stress : To some extent   Social Connections: Moderately Isolated (07/18/2023)   Social Connection and Isolation Panel [NHANES]    Frequency of Communication with Friends and Family: Three times a week    Frequency of Social Gatherings with Friends and Family: Three times a week    Attends Religious Services: Never    Active Member of Clubs or Organizations: No    Attends Banker Meetings: Not on file    Marital Status: Living with partner  Intimate Partner Violence: Not At Risk (10/26/2022)   Received from [facility]   HITS    Over the last 12 months how often did your partner physically hurt you?: Never    Over the  last 12 months how often did your partner insult you or talk down to you?: Never    Over the last 12 months how often did your partner threaten you with physical harm?: Never    Over the last 12 months how often did your partner scream or curse at you?: Never    Objective   BP 126/78 (BP Location: Left Arm, Patient Position: Sitting)   Pulse 90   Temp 98 F (36.7 C) (Oral)   Ht 6\' 2"  (1.88 m)   Wt 242 lb 1.6 oz (109.8 kg)   SpO2 96%   BMI 31.08 kg/m   Physical Exam  48 year old male in no acute distress Cardiovascular exam with regular rate and rhythm Lungs clear to auscultation bilaterally  Assessment & Plan:   Wellness examination -     CBC with Differential/Platelet; Future -     Comprehensive metabolic panel; Future -     Hemoglobin A1c; Future -     Lipid panel; Future -     TSH Rfx on Abnormal to Free T4; Future  Primary hypertension Assessment & Plan: Blood pressure initially elevated, did improve on recheck, at goal on recheck.  Can continue with current medication regimen, refill of telmisartan sent to pharmacy on file Recommend intermittent monitoring of blood pressure at home, DASH diet   Anxiety Assessment & Plan: Can continue with current regimen of fluoxetine and alprazolam PDMP reviewed, no red flags, provided refill of  alprazolam.   Hyperlipidemia, unspecified hyperlipidemia type Assessment & Plan: Has been taking atorvastatin for a number of years, denies any issues with medication, no reported myalgias.  Can continue atorvastatin at this time.  We will plan to recheck lipid panel with upcoming labs for physical   Other orders -     ALPRAZolam; Take 1 tablet (0.5 mg total) by mouth daily as needed.  Dispense: 20 tablet; Refill: 0 -     Telmisartan; Take 1 tablet (40 mg total) by mouth daily.  Dispense: 90 tablet; Refill: 1  Return in about 1 month (around 08/18/2023) for CPE with fasting labs 1 week prior.    ___________________________________________ Jimmey Hengel de [place], MD, ABFM, CAQSM Primary Care and Sports Medicine [facility]

## 2023-07-18 NOTE — Assessment & Plan Note (Signed)
Blood pressure initially elevated, did improve on recheck, at goal on recheck.  Can continue with current medication regimen, refill of telmisartan sent to pharmacy on file Recommend intermittent monitoring of blood pressure at home, DASH diet

## 2023-07-18 NOTE — Addendum Note (Signed)
Addended by: DE Peru, Serenitie Vinton J on: 07/18/2023 02:58 PM   Modules accepted: Level of Service

## 2023-07-18 NOTE — Assessment & Plan Note (Signed)
Has been taking atorvastatin for a number of years, denies any issues with medication, no reported myalgias.  Can continue atorvastatin at this time.  We will plan to recheck lipid panel with upcoming labs for physical

## 2023-07-18 NOTE — Assessment & Plan Note (Signed)
Can continue with current regimen of fluoxetine and alprazolam PDMP reviewed, no red flags, provided refill of alprazolam.

## 2023-07-31 DIAGNOSIS — G4733 Obstructive sleep apnea (adult) (pediatric): Secondary | ICD-10-CM | POA: Diagnosis not present

## 2023-08-24 ENCOUNTER — Other Ambulatory Visit (HOSPITAL_BASED_OUTPATIENT_CLINIC_OR_DEPARTMENT_OTHER): Payer: Self-pay | Admitting: *Deleted

## 2023-08-24 DIAGNOSIS — Z0000 Encounter for general adult medical examination without abnormal findings: Secondary | ICD-10-CM

## 2023-08-25 LAB — COMPREHENSIVE METABOLIC PANEL
ALT: 40 IU/L (ref 0–44)
AST: 29 IU/L (ref 0–40)
Albumin: 4.5 g/dL (ref 4.1–5.1)
Alkaline Phosphatase: 87 IU/L (ref 44–121)
BUN/Creatinine Ratio: 20 (ref 9–20)
BUN: 18 mg/dL (ref 6–24)
Bilirubin Total: 0.6 mg/dL (ref 0.0–1.2)
CO2: 21 mmol/L (ref 20–29)
Calcium: 9.7 mg/dL (ref 8.7–10.2)
Chloride: 103 mmol/L (ref 96–106)
Creatinine, Ser: 0.89 mg/dL (ref 0.76–1.27)
Globulin, Total: 2.8 g/dL (ref 1.5–4.5)
Glucose: 90 mg/dL (ref 70–99)
Potassium: 4.8 mmol/L (ref 3.5–5.2)
Sodium: 138 mmol/L (ref 134–144)
Total Protein: 7.3 g/dL (ref 6.0–8.5)
eGFR: 105 mL/min/{1.73_m2} (ref >59)

## 2023-08-25 LAB — CBC WITH DIFFERENTIAL/PLATELET
Basophils Absolute: 0 10*3/uL (ref 0.0–0.2)
Basos: 1 %
EOS (ABSOLUTE): 0.1 10*3/uL (ref 0.0–0.4)
Eos: 2 %
Hematocrit: 46.8 % (ref 37.5–51.0)
Hemoglobin: 15.4 g/dL (ref 13.0–17.7)
Immature Grans (Abs): 0 10*3/uL (ref 0.0–0.1)
Immature Granulocytes: 1 %
Lymphocytes Absolute: 1.4 10*3/uL (ref 0.7–3.1)
Lymphs: 31 %
MCH: 28.5 pg (ref 26.6–33.0)
MCHC: 32.9 g/dL (ref 31.5–35.7)
MCV: 87 fL (ref 79–97)
Monocytes Absolute: 0.3 10*3/uL (ref 0.1–0.9)
Monocytes: 7 %
Neutrophils Absolute: 2.6 10*3/uL (ref 1.4–7.0)
Neutrophils: 58 %
Platelets: 258 10*3/uL (ref 150–450)
RBC: 5.41 x10E6/uL (ref 4.14–5.80)
RDW: 12.6 % (ref 11.6–15.4)
WBC: 4.4 10*3/uL (ref 3.4–10.8)

## 2023-08-25 LAB — HEMOGLOBIN A1C
Est. average glucose Bld gHb Est-mCnc: 108 mg/dL
Hgb A1c MFr Bld: 5.4 % (ref 4.8–5.6)

## 2023-08-25 LAB — TSH RFX ON ABNORMAL TO FREE T4: TSH: 1.47 u[IU]/mL (ref 0.450–4.500)

## 2023-08-25 LAB — LIPID PANEL
Chol/HDL Ratio: 2.9 ratio (ref 0.0–5.0)
Cholesterol, Total: 156 mg/dL (ref 100–199)
HDL: 53 mg/dL (ref >39)
LDL Chol Calc (NIH): 89 mg/dL (ref 0–99)
Triglycerides: 72 mg/dL (ref 0–149)
VLDL Cholesterol Cal: 14 mg/dL (ref 5–40)

## 2023-08-30 ENCOUNTER — Ambulatory Visit (HOSPITAL_BASED_OUTPATIENT_CLINIC_OR_DEPARTMENT_OTHER): Payer: BC Managed Care – PPO | Admitting: Family Medicine

## 2023-08-30 ENCOUNTER — Encounter (HOSPITAL_BASED_OUTPATIENT_CLINIC_OR_DEPARTMENT_OTHER): Payer: Self-pay | Admitting: Family Medicine

## 2023-08-30 VITALS — BP 163/93 | HR 82 | Ht 76.0 in | Wt 230.1 lb

## 2023-08-30 DIAGNOSIS — N5089 Other specified disorders of the male genital organs: Secondary | ICD-10-CM | POA: Insufficient documentation

## 2023-08-30 DIAGNOSIS — I6529 Occlusion and stenosis of unspecified carotid artery: Secondary | ICD-10-CM | POA: Diagnosis not present

## 2023-08-30 DIAGNOSIS — Z1211 Encounter for screening for malignant neoplasm of colon: Secondary | ICD-10-CM | POA: Diagnosis not present

## 2023-08-30 DIAGNOSIS — Z0000 Encounter for general adult medical examination without abnormal findings: Secondary | ICD-10-CM | POA: Diagnosis not present

## 2023-08-30 DIAGNOSIS — Z3009 Encounter for other general counseling and advice on contraception: Secondary | ICD-10-CM

## 2023-08-30 NOTE — Patient Instructions (Signed)
  Medication Instructions:  Your physician recommends that you continue on your current medications as directed. Please refer to the Current Medication list given to you today. --If you need a refill on any your medications before your next appointment, please call your pharmacy first. If no refills are authorized on file call the office.--   Referrals/Procedures/Imaging: Ultrasound - [facility] Referral placed  Follow-Up: Your next appointment:   Your physician recommends that you schedule a follow-up appointment in: 3-4 month follow up  with Dr. de Peru  You will receive a text message or e-mail with a link to a survey about your care and experience with [place] today! We would greatly appreciate your feedback!   Thanks for letting us be apart of your health journey!!  Primary Care and Sports Medicine   Dr. Ceasar Mons Peru   We encourage you to activate your patient portal called "MyChart".  Sign up information is provided on this After Visit Summary.  MyChart is used to connect with patients for Virtual Visits (Telemedicine).  Patients are able to view lab/test results, encounter notes, upcoming appointments, etc.  Non-urgent messages can be sent to your provider as well. To learn more about what you can do with MyChart, please visit --  [email or web address].

## 2023-08-30 NOTE — Assessment & Plan Note (Signed)
 Routine HCM labs reviewed. HCM reviewed/discussed. Anticipatory guidance regarding healthy weight, lifestyle and choices given. Recommend healthy diet.  Recommend approximately 150 minutes/week of moderate intensity exercise Recommend regular dental and vision exams Always use seatbelt/lap and shoulder restraints Recommend using smoke alarms and checking batteries at least twice a year Recommend using sunscreen when outside Discussed colon cancer screening recommendations, options.  Patient prefers to proceed with colonoscopy, referral placed Discussed immunization recommendations

## 2023-08-30 NOTE — Progress Notes (Unsigned)
 Subjective:    CC: Annual Physical Exam  HPI:  John Herman is a 48 y.o. presenting for annual physical  I reviewed the past medical history, family history, social history, surgical history, and allergies today and no changes were needed.  Please see the problem list section below in epic for further details.  Past Medical History: Past Medical History:  Diagnosis Date   Anxiety    Asthma    Chest wall pain    Hyperlipidemia    Hypertension    Laceration of finger    Migraine headache    Sleep apnea    Past Surgical History: Past Surgical History:  Procedure Laterality Date   ADENOIDECTOMY     APPENDECTOMY  2005   MYRINGOTOMY WITH TUBE PLACEMENT  2008   NASAL SEPTUM SURGERY  1994   TYMPANOSTOMY TUBE PLACEMENT     Social History: Social History   Socioeconomic History   Marital status: Divorced    Spouse name: Not on file   Number of children: Not on file   Years of education: Not on file   Highest education level: Bachelor's degree (e.g., BA, AB, BS)  Occupational History   Not on file  Tobacco Use   Smoking status: Former    Current packs/day: 0.00    Types: Cigarettes    Quit date: 02/10/2004    Years since quitting: 19.5    Passive exposure: Past   Smokeless tobacco: Former  Building services engineer status: Never Used  Substance and Sexual Activity   Alcohol use: Yes    Comment: SOCIALLY   Drug use: No   Sexual activity: Yes  Other Topics Concern   Not on file  Social History Narrative   Not on file   Social Drivers of Health   Financial Resource Strain: Low Risk  (07/18/2023)   Overall Financial Resource Strain (CARDIA)    Difficulty of Paying Living Expenses: Not hard at all  Food Insecurity: No Food Insecurity (07/18/2023)   Hunger Vital Sign    Worried About Running Out of Food in the Last Year: Never true    Ran Out of Food in the Last Year: Never true  Transportation Needs: No Transportation Needs (07/18/2023)   PRAPARE - Therapist, art (Medical): No    Lack of Transportation (Non-Medical): No  Physical Activity: Insufficiently Active (07/18/2023)   Exercise Vital Sign    Days of Exercise per Week: 2 days    Minutes of Exercise per Session: 20 min  Stress: Stress Concern Present (07/18/2023)   [employer] of Occupational Health - Occupational Stress Questionnaire    Feeling of Stress : To some extent  Social Connections: Moderately Isolated (07/18/2023)   Social Connection and Isolation Panel [NHANES]    Frequency of Communication with Friends and Family: Three times a week    Frequency of Social Gatherings with Friends and Family: Three times a week    Attends Religious Services: Never    Active Member of Clubs or Organizations: No    Attends Engineer, structural: Not on file    Marital Status: Living with partner   Family History: Family History  Problem Relation Age of Onset   Hypertension Mother    Hypertension Father    Alcoholism Sister    Other Sister        HEALTHY   Other Sister        HEALTHY   Allergies: No Known Allergies Medications: See med  rec.  Review of Systems: No headache, visual changes, nausea, vomiting, diarrhea, constipation, dizziness, abdominal pain, skin rash, fevers, chills, night sweats, swollen lymph nodes, weight loss, chest pain, body aches, joint swelling, muscle aches, shortness of breath, mood changes, visual or auditory hallucinations.  Objective:    BP (!) 163/93 (BP Location: Right Arm, Patient Position: Sitting, Cuff Size: Normal)   Pulse 82   Ht 6\' 4"  (1.93 m)   Wt 230 lb 1.6 oz (104.4 kg)   SpO2 96%   BMI 28.01 kg/m   General: Well Developed, well nourished, and in no acute distress. Neuro: Alert and oriented x3, extra-ocular muscles intact, sensation grossly intact. Cranial nerves II through XII are intact, motor, sensory, and coordinative functions are all intact. HEENT: Normocephalic, atraumatic, pupils equal round reactive  to light, neck supple, no masses, no lymphadenopathy, thyroid nonpalpable. Oropharynx, nasopharynx, external ear canals are unremarkable. Skin: Warm and dry, no rashes noted. Cardiac: Regular rate and rhythm, no murmurs rubs or gallops. Respiratory: Clear to auscultation bilaterally. Not using accessory muscles, speaking in full sentences. Abdominal: Soft, nontender, nondistended, positive bowel sounds, no masses, no organomegaly. Musculoskeletal: Shoulder, elbow, wrist, hip, knee, ankle stable, and with full range of motion.  Impression and Recommendations:    Wellness examination Assessment & Plan: Routine HCM labs reviewed. HCM reviewed/discussed. Anticipatory guidance regarding healthy weight, lifestyle and choices given. Recommend healthy diet.  Recommend approximately 150 minutes/week of moderate intensity exercise Recommend regular dental and vision exams Always use seatbelt/lap and shoulder restraints Recommend using smoke alarms and checking batteries at least twice a year Recommend using sunscreen when outside Discussed colon cancer screening recommendations, options.  Patient prefers to proceed with colonoscopy, referral placed Discussed immunization recommendations   Special screening for malignant neoplasms, colon -     Ambulatory referral to Gastroenterology  Carotid artery calcification, unspecified laterality Assessment & Plan: Patient has brought copy of images obtained during dental evaluation which indicated calcifications in area of carotid artery.  Has questions related to this finding and need for additional evaluation.  He currently is asymptomatic.  Given observed calcifications, we can proceed with additional evaluation utilizing ultrasound initially.  Further recommendations pending results of testing  Orders: -     US Carotid Bilateral; Future  Vasectomy evaluation -     Ambulatory referral to Urology  Testicular swelling Assessment & Plan: Reports  history of testicular swelling for which he has had ultrasound the past which reportedly was unremarkable.  He has questions as he still continues to have intermittent issues with this.  Given ongoing issues with reported normal imaging in the past, we can proceed with referral to specialist for further evaluation and consideration related to additional testing or evaluation  Orders: -     Ambulatory referral to Urology  Return in about 4 months (around 12/30/2023).   ___________________________________________ Liliya Fullenwider de [place], MD, ABFM, CAQSM Primary Care and Sports Medicine [facility]

## 2023-08-31 NOTE — Assessment & Plan Note (Signed)
 Reports history of testicular swelling for which he has had ultrasound the past which reportedly was unremarkable.  He has questions as he still continues to have intermittent issues with this.  Given ongoing issues with reported normal imaging in the past, we can proceed with referral to specialist for further evaluation and consideration related to additional testing or evaluation

## 2023-08-31 NOTE — Assessment & Plan Note (Signed)
 Patient has brought copy of images obtained during dental evaluation which indicated calcifications in area of carotid artery.  Has questions related to this finding and need for additional evaluation.  He currently is asymptomatic.  Given observed calcifications, we can proceed with additional evaluation utilizing ultrasound initially.  Further recommendations pending results of testing

## 2023-09-06 ENCOUNTER — Ambulatory Visit
Admission: RE | Admit: 2023-09-06 | Discharge: 2023-09-06 | Disposition: A | Discharge status: Home / Self Care | Source: Ambulatory Visit | Attending: Family Medicine

## 2023-09-06 DIAGNOSIS — R0989 Other specified symptoms and signs involving the circulatory and respiratory systems: Secondary | ICD-10-CM | POA: Diagnosis not present

## 2023-09-06 DIAGNOSIS — I6529 Occlusion and stenosis of unspecified carotid artery: Secondary | ICD-10-CM

## 2023-09-11 ENCOUNTER — Other Ambulatory Visit (HOSPITAL_BASED_OUTPATIENT_CLINIC_OR_DEPARTMENT_OTHER): Payer: Self-pay | Admitting: *Deleted

## 2023-09-11 ENCOUNTER — Encounter (HOSPITAL_BASED_OUTPATIENT_CLINIC_OR_DEPARTMENT_OTHER): Payer: Self-pay | Admitting: Family Medicine

## 2023-09-11 MED ORDER — FLUOXETINE HCL 40 MG PO CAPS: 40.0000 mg | ORAL_CAPSULE | Freq: Every day | ORAL | 1 refills | Status: DC

## 2023-09-14 ENCOUNTER — Encounter (HOSPITAL_BASED_OUTPATIENT_CLINIC_OR_DEPARTMENT_OTHER): Payer: Self-pay | Admitting: Family Medicine

## 2023-09-27 DIAGNOSIS — Z3009 Encounter for other general counseling and advice on contraception: Secondary | ICD-10-CM | POA: Diagnosis not present

## 2023-09-28 ENCOUNTER — Other Ambulatory Visit (HOSPITAL_BASED_OUTPATIENT_CLINIC_OR_DEPARTMENT_OTHER): Payer: Self-pay | Admitting: *Deleted

## 2023-09-28 ENCOUNTER — Encounter (HOSPITAL_BASED_OUTPATIENT_CLINIC_OR_DEPARTMENT_OTHER): Payer: Self-pay | Admitting: Family Medicine

## 2023-09-28 MED ORDER — ATORVASTATIN CALCIUM 20 MG PO TABS: 20.0000 mg | ORAL_TABLET | Freq: Every day | ORAL | 11 refills | Status: AC

## 2023-10-08 ENCOUNTER — Encounter (HOSPITAL_BASED_OUTPATIENT_CLINIC_OR_DEPARTMENT_OTHER): Payer: Self-pay | Admitting: Family Medicine

## 2023-10-09 ENCOUNTER — Other Ambulatory Visit (HOSPITAL_BASED_OUTPATIENT_CLINIC_OR_DEPARTMENT_OTHER): Payer: Self-pay | Admitting: *Deleted

## 2023-10-09 MED ORDER — AMLODIPINE BESYLATE 5 MG PO TABS: 10.0000 mg | ORAL_TABLET | Freq: Every day | ORAL | 0 refills | Status: DC

## 2023-10-19 HISTORY — PX: VASECTOMY: SHX75

## 2023-11-04 ENCOUNTER — Other Ambulatory Visit (HOSPITAL_BASED_OUTPATIENT_CLINIC_OR_DEPARTMENT_OTHER): Payer: Self-pay | Admitting: Family Medicine

## 2023-11-06 DIAGNOSIS — S61319D Laceration without foreign body of unspecified finger with damage to nail, subsequent encounter: Secondary | ICD-10-CM | POA: Diagnosis not present

## 2023-11-06 DIAGNOSIS — Z23 Encounter for immunization: Secondary | ICD-10-CM | POA: Diagnosis not present

## 2023-11-16 DIAGNOSIS — Z302 Encounter for sterilization: Secondary | ICD-10-CM | POA: Diagnosis not present

## 2023-11-17 ENCOUNTER — Encounter: Payer: Self-pay | Admitting: Family Medicine

## 2023-11-30 ENCOUNTER — Encounter (HOSPITAL_BASED_OUTPATIENT_CLINIC_OR_DEPARTMENT_OTHER): Payer: Self-pay | Admitting: Family Medicine

## 2023-12-01 MED ORDER — AMLODIPINE BESYLATE 10 MG PO TABS: 10.0000 mg | ORAL_TABLET | Freq: Every day | ORAL | 3 refills | Status: AC

## 2024-01-08 ENCOUNTER — Ambulatory Visit (HOSPITAL_BASED_OUTPATIENT_CLINIC_OR_DEPARTMENT_OTHER): Admitting: Family Medicine

## 2024-01-16 ENCOUNTER — Ambulatory Visit (INDEPENDENT_AMBULATORY_CARE_PROVIDER_SITE_OTHER): Admitting: Family Medicine

## 2024-01-16 ENCOUNTER — Encounter (HOSPITAL_BASED_OUTPATIENT_CLINIC_OR_DEPARTMENT_OTHER): Payer: Self-pay | Admitting: Family Medicine

## 2024-01-16 VITALS — BP 132/88 | HR 89 | Ht 74.0 in | Wt 225.7 lb

## 2024-01-16 DIAGNOSIS — I10 Essential (primary) hypertension: Secondary | ICD-10-CM

## 2024-01-16 DIAGNOSIS — F419 Anxiety disorder, unspecified: Secondary | ICD-10-CM

## 2024-01-16 DIAGNOSIS — G4730 Sleep apnea, unspecified: Secondary | ICD-10-CM | POA: Diagnosis not present

## 2024-01-16 MED ORDER — ALPRAZOLAM 0.5 MG PO TABS: 0.5000 mg | ORAL_TABLET | Freq: Every day | ORAL | 0 refills | Status: AC | PRN

## 2024-01-16 NOTE — Assessment & Plan Note (Signed)
 That is primarily managed with separate provider.  Was utilizing CPAP in the past, however reports that he has switched to oral appliance and feels that he has been sleeping very well.  Denies any concerns with notable daytime fatigue.  Does feel rested when waking up. We did discuss additional options as Zepbound has recently received approval for specific management of sleep apnea.  He has looked to try GLP-1 receptor agonist in the past, but was denied by insurance previously.  He may be interested in trying to obtain authorization for this in the future.  He would like to revisit this issue at next appointment in about 4 to 6 months

## 2024-01-16 NOTE — Patient Instructions (Signed)

## 2024-01-16 NOTE — Progress Notes (Signed)
    Procedures performed today:    None.  Independent interpretation of notes and tests performed by another provider:   None.  Brief History, Exam, Impression, and Recommendations:    BP 132/88 (BP Location: Right Arm, Patient Position: Sitting, Cuff Size: Large)   Pulse 89   Ht 6' 2 (1.88 m)   Wt 225 lb 11.2 oz (102.4 kg)   SpO2 97%   BMI 28.98 kg/m   Anxiety Assessment & Plan: Patient denies any concerns today.  Continues with medications as prescribed including fluoxetine  and alprazolam .  He is requesting refill of alprazolam  today.  Reports that he typically will take this when traveling due to increased symptoms.  Denies any concerns with medication.  Can continue with current regimen of fluoxetine  and alprazolam  PDMP reviewed, no red flags, provided refill of alprazolam .   Primary hypertension Assessment & Plan: Blood pressure borderline in office today.  Orts that he does check intermittently at home, blood pressure readings at home have been similar to office reading, reading this morning at home was 132/80.  Denies any concerns with chest pain or headaches. Can continue with current medication regimen, no refills needed today Recommend intermittent monitoring of blood pressure at home, DASH diet   Sleep apnea, unspecified type Assessment & Plan: That is primarily managed with separate provider.  Was utilizing CPAP in the past, however reports that he has switched to oral appliance and feels that he has been sleeping very well.  Denies any concerns with notable daytime fatigue.  Does feel rested when waking up. We did discuss additional options as Zepbound has recently received approval for specific management of sleep apnea.  He has looked to try GLP-1 receptor agonist in the past, but was denied by insurance previously.  He may be interested in trying to obtain authorization for this in the future.  He would like to revisit this issue at next appointment in about 4 to  6 months   Other orders -     ALPRAZolam ; Take 1 tablet (0.5 mg total) by mouth daily as needed.  Dispense: 20 tablet; Refill: 0  Return in about 6 months (around 07/18/2024) for hypertension.   ___________________________________________ Jabri Blancett de [place], MD, ABFM, CAQSM Primary Care and Sports Medicine [facility]

## 2024-01-16 NOTE — Assessment & Plan Note (Signed)
 Patient denies any concerns today.  Continues with medications as prescribed including fluoxetine  and alprazolam .  He is requesting refill of alprazolam  today.  Reports that he typically will take this when traveling due to increased symptoms.  Denies any concerns with medication.  Can continue with current regimen of fluoxetine  and alprazolam  PDMP reviewed, no red flags, provided refill of alprazolam .

## 2024-01-16 NOTE — Assessment & Plan Note (Signed)
 Blood pressure borderline in office today.  Orts that he does check intermittently at home, blood pressure readings at home have been similar to office reading, reading this morning at home was 132/80.  Denies any concerns with chest pain or headaches. Can continue with current medication regimen, no refills needed today Recommend intermittent monitoring of blood pressure at home, DASH diet

## 2024-02-03 ENCOUNTER — Other Ambulatory Visit (HOSPITAL_BASED_OUTPATIENT_CLINIC_OR_DEPARTMENT_OTHER): Payer: Self-pay | Admitting: Family Medicine

## 2024-03-05 ENCOUNTER — Other Ambulatory Visit (HOSPITAL_BASED_OUTPATIENT_CLINIC_OR_DEPARTMENT_OTHER): Payer: Self-pay | Admitting: Family Medicine

## 2024-05-28 DIAGNOSIS — F4322 Adjustment disorder with anxiety: Secondary | ICD-10-CM | POA: Diagnosis not present

## 2024-07-18 ENCOUNTER — Ambulatory Visit (HOSPITAL_BASED_OUTPATIENT_CLINIC_OR_DEPARTMENT_OTHER): Admitting: Family Medicine

## 2024-07-18 ENCOUNTER — Other Ambulatory Visit (HOSPITAL_BASED_OUTPATIENT_CLINIC_OR_DEPARTMENT_OTHER): Payer: Self-pay
